# Patient Record
Sex: Female | Born: 1960 | Race: White | Hispanic: No | Marital: Married | Smoking: Former smoker
Health system: Southern US, Community
[De-identification: ages and names within clinical notes are randomized; demographics above are authoritative.]

## PROBLEM LIST (undated history)

## (undated) DIAGNOSIS — I10 Essential (primary) hypertension: Secondary | ICD-10-CM

## (undated) DIAGNOSIS — G4700 Insomnia, unspecified: Secondary | ICD-10-CM

## (undated) DIAGNOSIS — E785 Hyperlipidemia, unspecified: Secondary | ICD-10-CM

## (undated) HISTORY — PX: EYE SURGERY: SHX253

## (undated) HISTORY — PX: ABDOMINAL HYSTERECTOMY: SHX81

## (undated) HISTORY — DX: Insomnia, unspecified: G47.00

## (undated) HISTORY — PX: GALLBLADDER SURGERY: SHX652

## (undated) HISTORY — PX: TUBAL LIGATION: SHX77

## (undated) HISTORY — PX: COLONOSCOPY: SHX174

## (undated) HISTORY — DX: Hyperlipidemia, unspecified: E78.5

## (undated) HISTORY — PX: OVARIAN CYST REMOVAL: SHX89

---

## 2010-07-30 ENCOUNTER — Ambulatory Visit: Payer: Self-pay | Admitting: Internal Medicine

## 2010-10-06 ENCOUNTER — Ambulatory Visit: Payer: Self-pay | Admitting: Internal Medicine

## 2012-04-13 ENCOUNTER — Emergency Department: Payer: Self-pay | Admitting: Emergency Medicine

## 2012-04-13 LAB — CBC WITH DIFFERENTIAL/PLATELET
Basophil #: 0 10*3/uL (ref 0.0–0.1)
Basophil %: 0.6 %
Eosinophil #: 0.1 10*3/uL (ref 0.0–0.7)
Eosinophil %: 1.6 %
HGB: 13.9 g/dL (ref 12.0–16.0)
Lymphocyte #: 1.9 10*3/uL (ref 1.0–3.6)
Lymphocyte %: 31 %
MCH: 30.8 pg (ref 26.0–34.0)
MCHC: 33.8 g/dL (ref 32.0–36.0)
MCV: 91 fL (ref 80–100)
Monocyte #: 0.6 x10 3/mm (ref 0.2–0.9)
Monocyte %: 9.8 %
Neutrophil #: 3.5 10*3/uL (ref 1.4–6.5)
Neutrophil %: 57 %
Platelet: 289 10*3/uL (ref 150–440)
RBC: 4.49 10*6/uL (ref 3.80–5.20)
WBC: 6.2 10*3/uL (ref 3.6–11.0)

## 2012-04-13 LAB — URINALYSIS, COMPLETE
Bacteria: NONE SEEN
Bilirubin,UR: NEGATIVE
Blood: NEGATIVE
Ketone: NEGATIVE
Nitrite: NEGATIVE
Ph: 6 (ref 4.5–8.0)
Protein: NEGATIVE
Specific Gravity: 1.018 (ref 1.003–1.030)
Squamous Epithelial: 10
WBC UR: 1 /HPF (ref 0–5)

## 2012-04-13 LAB — BASIC METABOLIC PANEL
Anion Gap: 6 — ABNORMAL LOW (ref 7–16)
BUN: 13 mg/dL (ref 7–18)
Chloride: 105 mmol/L (ref 98–107)
Co2: 28 mmol/L (ref 21–32)
Creatinine: 0.66 mg/dL (ref 0.60–1.30)

## 2012-04-14 LAB — CA 125: CA 125: 33.6 U/mL (ref 0.0–34.0)

## 2012-04-18 ENCOUNTER — Ambulatory Visit: Payer: Self-pay | Admitting: Obstetrics & Gynecology

## 2012-04-18 LAB — CBC
HGB: 14 g/dL (ref 12.0–16.0)
MCH: 30.4 pg (ref 26.0–34.0)
MCHC: 33.8 g/dL (ref 32.0–36.0)
Platelet: 320 10*3/uL (ref 150–440)
RDW: 13.5 % (ref 11.5–14.5)

## 2012-04-18 LAB — PREGNANCY, URINE: Pregnancy Test, Urine: NEGATIVE m[IU]/mL

## 2012-04-20 ENCOUNTER — Inpatient Hospital Stay: Payer: Self-pay | Admitting: Obstetrics & Gynecology

## 2012-04-23 LAB — PATHOLOGY REPORT

## 2014-02-12 ENCOUNTER — Ambulatory Visit: Payer: Self-pay | Admitting: Obstetrics & Gynecology

## 2014-02-18 ENCOUNTER — Ambulatory Visit: Payer: Self-pay | Admitting: Obstetrics & Gynecology

## 2014-04-25 NOTE — Op Note (Signed)
PATIENT NAME:  Brooke Schultz, Brooke Schultz MR#:  [id number] DATE OF BIRTH:  Sep 03, 1960  DATE OF PROCEDURE:  04/20/2012  PREOPERATIVE DIAGNOSIS:  Large ovarian cyst, pelvic pain.   POSTOPERATIVE DIAGNOSIS:  Left ovarian cyst, pelvic pain.   PROCEDURES:  Exploratory laparotomy, left salpingo-oophorectomy.   SURGEON:  Glean Salen, M.D.   ASSISTANT:  Verlene Mayer, M.D.   ANESTHESIA: General.   ESTIMATED BLOOD LOSS: Minimal.   COMPLICATIONS: None.   FINDINGS: Large clear-filled cyst of the left ovary with normal left fallopian tube, right tube and ovary, uterus and other abdominal organs.   DISPOSITION: To recovery room in stable condition.   TECHNIQUE: The patient is prepped and draped in the usual sterile fashion after adequate anesthesia is obtained, in supine position, on the operating table, with a Foley catheter in place. Skin incision was made in the midline in vertical fashion using a scalpel down to the level of the rectus fascia. This incision is entered to the umbilicus and superior to the pubic symphysis. The rectus fascia is identified and an incision is then dissected superiorly and inferiorly using Mayo scissors. The midline of the rectus abdominis muscles were identified and separated and the peritoneum is penetrated. Immediately identification of a large ovarian cystic mass is identified and is gently palpated up through the incision. There are no adhesions noted and easily is mobile to move around. The pedicle is identified and clamped doubly with Heaney clamps, transected and suture ligated. Excellent hemostasis is noted at this time.  The ovary along with the fallopian tube is then handed off to take as specimen. Examination of the pedicle reveals excellent hemostasis as well as intra-abdominal expiration of other organs. The right ovary is small, somewhat atrophic, and normal in appearance along with the fallopian tube and uterus as well. There is no bleeding noted and no other need  for other procedures done. The sponge and needle counts are correct at this time. The peritoneum is closed with a Vicryl suture. The rectus fascia is closed with a 0 PDS suture in a running fashion. Subcutaneous tissues are irrigated and hemostasis is assured using electrocautery. Skin is closed with surgical clips and a sterile bandage is applied. The patient goes to the recovery room in stable condition. All sponge, instrument and needle counts are correct at the conclusion of the case. Foley catheter is left in place as she goes to the recovery room. ____________________________ R. Barnett Applebaum, MD rph:sb D: 04/20/2012 09:41:58 ET T: 04/20/2012 10:22:39 ET JOB#: [id number]  cc: Glean Salen, MD, <Dictator> Gae Dry MD ELECTRONICALLY SIGNED 04/20/2012 10:44

## 2014-04-28 LAB — SURGICAL PATHOLOGY

## 2014-05-04 NOTE — Op Note (Signed)
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  PROCEDURE: Total laparoscopic hysterectomy, right salpingo-oophorectomy and cystoscopy.  ANESTHESIA: General.  SURGEON: Denman George, MD  ASSISTANT: Aletha Halim, MD  BLOOD LOSS: 100 mL.  COMPLICATIONS: None.  FINDINGS: The patient had some adhesions on the bladder to the lower segment of the uterus and cervix.  DISPOSITION: To recovery room in stable condition.  TECHNIQUE: The patient is prepped and draped in the usual sterile fashion, after adequate anesthesia is obtained in the dorsal lithotomy position. A Foley catheter is inserted. A speculum was placed and a VCare device was placed on the cervix and uterus for manipulation purposes.  Attention is then turned to the abdomen where a Veress needle is inserted through a 5 mm infraumbilical incision, after Marcaine infused to anesthetize the skin. Veress needle placement is confirmed using the hanging drop technique and the abdomen is then insufflated with CO2 gas. A 5 mm trocar was then inserted under visualization with the laparoscope with no injuries or bleeding noted. The patient was placed in Trendelenburg positioning and the above findings are visualized.  An 11 mm trocar is placed in the right lower quadrant and a 5 mm trocar is placed in the left lower quadrant lateral to the epigastric blood vessels, no injuries or bleeding noted. Using a 5 mm Harmonic scalpel, a band of adhesions of the omentum to the anterior abdominal wall are carefully dissected free without bleeding or injury. The uterus and the right adnexa identified. The patient has evidence for prior left salpingo-oophorectomy. The infundibulopelvic blood vessels and ligaments are carefully  coagulated and cut on the right side, using the Harmonic scalpel. The round ligament is carefully coagulated and cut as well, and the dissection of the anterior and posterior leaves of the broad ligament is performed. Observation of ureter on the right side reveals that is out of harm's way. Dissection is carried down to the level of the uterine arteries which are carefully coagulated using the bipolar cautery device. The Harmonic scalpel is then used to dissect free the blood vessels and ligaments on the left side, including the round ligament, as the tube and ovary have already been removed in the past. Dissection is carried down to the level of the uterine arteries which are carefully coagulated and cut. The bladder is dissected free from adhesions on the anterior surface of the uterus and cervix. Careful dissection is performed until it is below the level of the cervix and the VCare device could be palpated and visualized at the cervicovaginal junction tenting through these structures. Using the Harmonic scalpel, a circumferential incision is made around the VCare device to completely amputate the uterus with cervix, holding onto the right adnexa, and this is removed vaginally.  A sponge is placed vaginally to maintain pneumoperitoneum. The vaginal cuff is closed with an interrupted 0 Polysorb suture using the Endostitch device in an interrupted fashion. Vaginal exam confirms complete closure.  The pelvic cavity is irrigated with saline and examination reveals no bleeding or injuries noted. Cystoscopy is performed with saline distention of the bladder and no visible injuries and urine seen to efflux from each ureteral orifice. Foley catheter is replaced.  Gas is expelled and trocars are removed. Skin is closed with Dermabond. The patient goes to recovery room in stable condition. All sponge,  instrument, and needle counts were correct.  ____________________________ R. Barnett Applebaum,  MD rph:TM D: 02/18/2014 16:24:00 ET T: 02/19/2014 00:52:51 ET JOB#: [id number]  cc: Glean Salen, MD, <Dictator> Gae Dry MD ELECTRONICALLY SIGNED 02/27/2014 16:00

## 2016-04-13 DIAGNOSIS — F5101 Primary insomnia: Secondary | ICD-10-CM | POA: Diagnosis not present

## 2016-04-13 DIAGNOSIS — D519 Vitamin B12 deficiency anemia, unspecified: Secondary | ICD-10-CM | POA: Diagnosis not present

## 2016-04-13 DIAGNOSIS — I10 Essential (primary) hypertension: Secondary | ICD-10-CM | POA: Diagnosis not present

## 2016-05-13 DIAGNOSIS — Z1231 Encounter for screening mammogram for malignant neoplasm of breast: Secondary | ICD-10-CM | POA: Diagnosis not present

## 2016-07-28 DIAGNOSIS — I10 Essential (primary) hypertension: Secondary | ICD-10-CM | POA: Diagnosis not present

## 2016-07-28 DIAGNOSIS — E782 Mixed hyperlipidemia: Secondary | ICD-10-CM | POA: Diagnosis not present

## 2016-08-22 ENCOUNTER — Emergency Department: Payer: 59

## 2016-08-22 ENCOUNTER — Emergency Department
Admission: EM | Admit: 2016-08-22 | Discharge: 2016-08-22 | Disposition: A | Discharge status: Home / Self Care | Payer: 59 | Attending: Emergency Medicine | Admitting: Emergency Medicine

## 2016-08-22 ENCOUNTER — Encounter: Payer: Self-pay | Admitting: Emergency Medicine

## 2016-08-22 DIAGNOSIS — M791 Myalgia, unspecified site: Secondary | ICD-10-CM

## 2016-08-22 DIAGNOSIS — R51 Headache: Secondary | ICD-10-CM | POA: Insufficient documentation

## 2016-08-22 DIAGNOSIS — F17210 Nicotine dependence, cigarettes, uncomplicated: Secondary | ICD-10-CM | POA: Diagnosis not present

## 2016-08-22 DIAGNOSIS — R519 Headache, unspecified: Secondary | ICD-10-CM

## 2016-08-22 DIAGNOSIS — R21 Rash and other nonspecific skin eruption: Secondary | ICD-10-CM | POA: Insufficient documentation

## 2016-08-22 DIAGNOSIS — I10 Essential (primary) hypertension: Secondary | ICD-10-CM | POA: Insufficient documentation

## 2016-08-22 DIAGNOSIS — J181 Lobar pneumonia, unspecified organism: Secondary | ICD-10-CM | POA: Diagnosis not present

## 2016-08-22 DIAGNOSIS — J189 Pneumonia, unspecified organism: Secondary | ICD-10-CM | POA: Diagnosis not present

## 2016-08-22 DIAGNOSIS — R509 Fever, unspecified: Secondary | ICD-10-CM | POA: Diagnosis not present

## 2016-08-22 HISTORY — DX: Essential (primary) hypertension: I10

## 2016-08-22 LAB — URINALYSIS, COMPLETE (UACMP) WITH MICROSCOPIC
BILIRUBIN URINE: NEGATIVE
Glucose, UA: NEGATIVE mg/dL
Hgb urine dipstick: NEGATIVE
Ketones, ur: 20 mg/dL — AB
LEUKOCYTES UA: NEGATIVE
Nitrite: NEGATIVE
PROTEIN: NEGATIVE mg/dL
RBC / HPF: NONE SEEN RBC/hpf (ref 0–5)
SPECIFIC GRAVITY, URINE: 1.013 (ref 1.005–1.030)
pH: 5 (ref 5.0–8.0)

## 2016-08-22 LAB — COMPREHENSIVE METABOLIC PANEL
ALBUMIN: 3.7 g/dL (ref 3.5–5.0)
ALT: 32 U/L (ref 14–54)
ANION GAP: 11 (ref 5–15)
AST: 25 U/L (ref 15–41)
Alkaline Phosphatase: 98 U/L (ref 38–126)
BUN: 13 mg/dL (ref 6–20)
CHLORIDE: 101 mmol/L (ref 101–111)
CO2: 24 mmol/L (ref 22–32)
Calcium: 8.8 mg/dL — ABNORMAL LOW (ref 8.9–10.3)
Creatinine, Ser: 0.79 mg/dL (ref 0.44–1.00)
GFR calc Af Amer: >60 mL/min (ref >60)
GFR calc non Af Amer: >60 mL/min (ref >60)
GLUCOSE: 113 mg/dL — AB (ref 65–99)
POTASSIUM: 3.5 mmol/L (ref 3.5–5.1)
Sodium: 136 mmol/L (ref 135–145)
Total Bilirubin: 0.6 mg/dL (ref 0.3–1.2)
Total Protein: 7.2 g/dL (ref 6.5–8.1)

## 2016-08-22 LAB — CBC WITH DIFFERENTIAL/PLATELET
Basophils Absolute: 0 10*3/uL (ref 0–0.1)
Basophils Relative: 0 %
Eosinophils Absolute: 0.1 10*3/uL (ref 0–0.7)
Eosinophils Relative: 1 %
HEMATOCRIT: 39.9 % (ref 35.0–47.0)
HEMOGLOBIN: 13.7 g/dL (ref 12.0–16.0)
LYMPHS PCT: 18 %
Lymphs Abs: 1.3 10*3/uL (ref 1.0–3.6)
MCH: 30.2 pg (ref 26.0–34.0)
MCHC: 34.3 g/dL (ref 32.0–36.0)
MCV: 88 fL (ref 80.0–100.0)
MONO ABS: 0.5 10*3/uL (ref 0.2–0.9)
MONOS PCT: 7 %
NEUTROS ABS: 5.5 10*3/uL (ref 1.4–6.5)
Neutrophils Relative %: 74 %
Platelets: 202 10*3/uL (ref 150–440)
RBC: 4.54 MIL/uL (ref 3.80–5.20)
RDW: 13.3 % (ref 11.5–14.5)
WBC: 7.4 10*3/uL (ref 3.6–11.0)

## 2016-08-22 LAB — SEDIMENTATION RATE: SED RATE: 51 mm/h — AB (ref 0–30)

## 2016-08-22 MED ORDER — PROCHLORPERAZINE EDISYLATE 5 MG/ML IJ SOLN
10.0000 mg | Freq: Once | INTRAMUSCULAR | Status: AC
  Administered 2016-08-22: 10 mg via INTRAVENOUS
  Filled 2016-08-22: qty 2

## 2016-08-22 MED ORDER — SODIUM CHLORIDE 0.9 % IV BOLUS (SEPSIS)
1000.0000 mL | Freq: Once | INTRAVENOUS | Status: AC
  Administered 2016-08-22: 1000 mL via INTRAVENOUS

## 2016-08-22 MED ORDER — DOXYCYCLINE HYCLATE 100 MG PO TABS
100.0000 mg | ORAL_TABLET | Freq: Once | ORAL | Status: AC
  Administered 2016-08-22: 100 mg via ORAL
  Filled 2016-08-22 (×2): qty 1

## 2016-08-22 MED ORDER — KETOROLAC TROMETHAMINE 15 MG/ML IJ SOLN
30.0000 mg | Freq: Once | INTRAMUSCULAR | Status: AC
  Administered 2016-08-22: 30 mg via INTRAVENOUS
  Filled 2016-08-22: qty 1

## 2016-08-22 MED ORDER — OXYCODONE-ACETAMINOPHEN 5-325 MG PO TABS
1.0000 | ORAL_TABLET | ORAL | Status: DC | PRN
  Administered 2016-08-22: 1 via ORAL
  Filled 2016-08-22: qty 1

## 2016-08-22 MED ORDER — DIPHENHYDRAMINE HCL 50 MG/ML IJ SOLN
25.0000 mg | Freq: Once | INTRAMUSCULAR | Status: AC
  Administered 2016-08-22: 25 mg via INTRAVENOUS
  Filled 2016-08-22: qty 1

## 2016-08-22 MED ORDER — DOXYCYCLINE HYCLATE 100 MG PO CAPS: 100.0000 mg | ORAL_CAPSULE | Freq: Two times a day (BID) | ORAL | 0 refills | Status: DC

## 2016-08-22 NOTE — ED Triage Notes (Signed)
Sick for 1 week with fever, aching, now with rash all over.  Mostly complains of headache.

## 2016-08-22 NOTE — ED Provider Notes (Addendum)
[facility] Emergency Department Provider Note  ____________________________________________   First MD Initiated Contact with Patient 08/22/16 1500     (approximate)  I have reviewed the triage vital signs and the nursing notes.   HISTORY  Chief Complaint Headache; Fever; Rash; and Generalized Body Aches   HPI Armani Gawlik is a 56 y.o. female with a history of hypertension and migraine headaches who is presenting with 1 week of chills, body aches and now over the past 2 days a frontal, 9 or 10 headache which she describes as a sharp feeling similar to migraines that she has had in years past. The patient says that she has not had a migraine for years. Denies any known sick contacts. Denies any travel. Denies any medication changes. She says that over the past 2 days she has had an intermittent headache that is frontal. There has been associated sensitivity to light and nausea but no vomiting. She says that she has not felt "hot enough" to take her temperature. Patient reports that she had 2 Tylenol early this morning about 7 or 8 AM.. Denies any known insect bites. Says the rash is also developed over the past 2 days which started on her bilateral lower extremity is now found in her chest, upper back, face and bilateral upper extremities. She denies any itching or pain to the rash. She denies any sore throat or nasal congestion but doesn't that she has had a mild cough. Denies any throat pain. Denies any other recent illnesses.patient denies any neck pain. Says that the headache radiates "to my eyes."   Past Medical History:  Diagnosis Date  . Hypertension     There are no active problems to display for this patient.   Past Surgical History:  Procedure Laterality Date  . ABDOMINAL HYSTERECTOMY      Prior to Admission medications   Not on File    Allergies Statins  No family history on file.  Social History Social History  Substance Use Topics    . Smoking status: Current Every Day Smoker  . Smokeless tobacco: Never Used  . Alcohol use No    Review of Systems  Constitutional: chills Eyes: No visual changes. ENT: No sore throat. Cardiovascular: Denies chest pain. Respiratory: as above Gastrointestinal: No abdominal pain.  no vomiting.  No diarrhea.  No constipation. Genitourinary: Negative for dysuria. Musculoskeletal: body aches  Skin: as above Neurological: Negative for focal weakness or numbness.   ____________________________________________   PHYSICAL EXAM:  VITAL SIGNS: ED Triage Vitals  Enc Vitals Group     BP 08/22/16 1124 132/90     Pulse Rate 08/22/16 1124 (!) 108     Resp 08/22/16 1124 18     Temp 08/22/16 1124 99 F (37.2 C)     Temp Source 08/22/16 1124 Oral     SpO2 08/22/16 1124 97 %     Weight 08/22/16 1124 212 lb (96.2 kg)     Height 08/22/16 1124 5\' 4"  (1.626 m)     Head Circumference --      Peak Flow --      Pain Score 08/22/16 1127 6     Pain Loc --      Pain Edu? --      Excl. in [place]? --     Constitutional: Alert and oriented.  No distress.  Eyes: Conjunctivae are normal.  Head: Atraumatic.no tenderness to palpation or nodularityalong the distribution of temporal arteries. Nose: No congestion/rhinnorhea. Mouth/Throat: Mucous membranes are  moist.  No erythema or lesions to the lips or tongue, uvula, tonsils or oral mucosa. Neck: No stridor.   Cardiovascular: Normal rate, regular rhythm. Heart rate of 87 in the room.Grossly normal heart sounds.   Respiratory: Normal respiratory effort.  No retractions. Lungs CTAB. Gastrointestinal: Soft and nontender. No distention.  Musculoskeletal: No lower extremity tenderness nor edema.  No joint effusions. Neurologic:  Normal speech and language. No gross focal neurologic deficits are appreciated. Skin:   Erythematous rash over the upper, central chestthat extends over the back to the upper thoracic back. It is also extending onto the neck and  around the eyes. There is a sandpaper-type texture to the rash. It is blanchable. There are no petechiae. There are no unroofed lesion seen around the lips. There are no lesions to the palms and soles. There are also scattered erythema that are nonpalpable to the bilateral upper extremitiesand the bilateral ankles. Psychiatric: Mood and affect are normal. Speech and behavior are normal.  ____________________________________________   LABS (all labs ordered are listed, but only abnormal results are displayed)  Labs Reviewed  COMPREHENSIVE METABOLIC PANEL - Abnormal; Notable for the following:       Result Value   Glucose, Bld 113 (*)    Calcium 8.8 (*)    All other components within normal limits  URINALYSIS, COMPLETE (UACMP) WITH MICROSCOPIC - Abnormal; Notable for the following:    Color, Urine YELLOW (*)    APPearance HAZY (*)    Ketones, ur 20 (*)    Bacteria, UA RARE (*)    Squamous Epithelial / LPF 6-30 (*)    All other components within normal limits  CULTURE, BLOOD (ROUTINE X 2)  CULTURE, BLOOD (ROUTINE X 2)  CBC WITH DIFFERENTIAL/PLATELET  B. BURGDORFI ANTIBODIES  ROCKY MTN SPOTTED FVR ABS PNL(IGG+IGM)   ____________________________________________  EKG   ____________________________________________  RADIOLOGY  No acute finding on the CT of the brain. Possible right-sided pneumonia versus atelectasis on the chest x-ray. ____________________________________________   PROCEDURES  Procedure(s) performed:   Procedures  Critical Care performed:   ____________________________________________   INITIAL IMPRESSION / ASSESSMENT AND PLAN / ED COURSE  Pertinent labs & imaging results that were available during my care of the patient were reviewed by me and considered in my medical decision making (see chart for details).  ----------------------------------------- 5:38 PM on 08/22/2016 -----------------------------------------  Patient says that her headache is  now down to a 4 out of 10. Reassuring labs. Heart rate remains in the 80s. So the elevated sedimentation rate but this is to be expected especially if there is a possible pneumonia. I will discharge the patient on doxycycline, twice a day for 10 days. We reviewed return precautions. She is understanding the plan and willing to comply. Furthermore, I did not see evidence of meningitis. Afebrile upon presentation here and last antipyretic she says was about 7 or 8 AM when she took 2 Tylenol this morning. Also without any meningismus. Also with her likely diagnosis of pneumonia. Unclear cause of the rash. However, does not fit any distinct pattern of a malignant rash at this time.      ____________________________________________   FINAL CLINICAL IMPRESSION(S) / ED DIAGNOSES  Rash. Pneumonia. Fever. Myalgia.    NEW MEDICATIONS STARTED DURING THIS VISIT:  New Prescriptions   No medications on file     Note:  This document was prepared using Dragon voice recognition software and may include unintentional dictation errors.     Orbie Pyo, MD 08/22/16  Lynden, Randall An, MD 08/22/16 1739

## 2016-08-22 NOTE — ED Triage Notes (Signed)
FIRST NURSE NOTE-PT c/o "not feeling well for 5 days". ambulatory without difficulty. Now started rash.

## 2016-08-22 NOTE — ED Notes (Signed)
Called pharmacy to send doxycycline and toradol.

## 2016-08-23 LAB — B. BURGDORFI ANTIBODIES

## 2016-08-24 LAB — ROCKY MTN SPOTTED FVR ABS PNL(IGG+IGM)
RMSF IGG: NEGATIVE
RMSF IgM: 0.76 index (ref 0.00–0.89)

## 2016-08-27 LAB — CULTURE, BLOOD (ROUTINE X 2)
Culture: NO GROWTH
Culture: NO GROWTH
Special Requests: ADEQUATE
Special Requests: ADEQUATE

## 2016-08-30 DIAGNOSIS — I10 Essential (primary) hypertension: Secondary | ICD-10-CM | POA: Diagnosis not present

## 2016-08-30 DIAGNOSIS — R21 Rash and other nonspecific skin eruption: Secondary | ICD-10-CM | POA: Diagnosis not present

## 2016-08-30 DIAGNOSIS — J168 Pneumonia due to other specified infectious organisms: Secondary | ICD-10-CM | POA: Diagnosis not present

## 2016-09-08 DIAGNOSIS — F5101 Primary insomnia: Secondary | ICD-10-CM | POA: Diagnosis not present

## 2016-09-08 DIAGNOSIS — J168 Pneumonia due to other specified infectious organisms: Secondary | ICD-10-CM | POA: Diagnosis not present

## 2016-09-08 DIAGNOSIS — I10 Essential (primary) hypertension: Secondary | ICD-10-CM | POA: Diagnosis not present

## 2016-10-26 DIAGNOSIS — J069 Acute upper respiratory infection, unspecified: Secondary | ICD-10-CM | POA: Diagnosis not present

## 2016-10-31 DIAGNOSIS — J069 Acute upper respiratory infection, unspecified: Secondary | ICD-10-CM | POA: Diagnosis not present

## 2016-12-20 ENCOUNTER — Other Ambulatory Visit: Payer: Self-pay | Admitting: Nurse Practitioner

## 2016-12-20 ENCOUNTER — Telehealth: Payer: Self-pay | Admitting: Nurse Practitioner

## 2016-12-20 DIAGNOSIS — F5101 Primary insomnia: Secondary | ICD-10-CM

## 2016-12-20 DIAGNOSIS — Z23 Encounter for immunization: Secondary | ICD-10-CM | POA: Diagnosis not present

## 2016-12-20 MED ORDER — ZOLPIDEM TARTRATE 10 MG PO TABS: 10.0000 mg | ORAL_TABLET | Freq: Every day | ORAL | 0 refills | Status: DC

## 2016-12-20 NOTE — Telephone Encounter (Signed)
I filled for 30 days and sent to her pharmacy. No refills given.

## 2016-12-20 NOTE — Telephone Encounter (Signed)
PATIENT MISSED HER LAST APPT IN NOV, WAS LAST SEEN IN September. I SCHEDULED PT 01/20/17 BECAUSE THAT IS YOUR NEXT AVAIL APPT IN PM. SHE WANTED TO KNOW COULD YOU REFILL HER AMBIEN?Marina Gravel LM

## 2016-12-20 NOTE — Progress Notes (Signed)
Refilled rx for ambien 10mg  QHS prn for 30 days with no refills.

## 2017-01-20 ENCOUNTER — Ambulatory Visit: Payer: 59 | Admitting: Nurse Practitioner

## 2017-01-20 ENCOUNTER — Encounter: Payer: Self-pay | Admitting: Nurse Practitioner

## 2017-01-20 VITALS — BP 132/70 | HR 80 | Resp 16 | Ht 64.0 in | Wt 216.8 lb

## 2017-01-20 DIAGNOSIS — F5101 Primary insomnia: Secondary | ICD-10-CM | POA: Diagnosis not present

## 2017-01-20 DIAGNOSIS — E669 Obesity, unspecified: Secondary | ICD-10-CM

## 2017-01-20 DIAGNOSIS — F411 Generalized anxiety disorder: Secondary | ICD-10-CM

## 2017-01-20 DIAGNOSIS — I10 Essential (primary) hypertension: Secondary | ICD-10-CM | POA: Diagnosis not present

## 2017-01-20 MED ORDER — ALPRAZOLAM 0.5 MG PO TABS: ORAL_TABLET | ORAL | 2 refills | Status: DC

## 2017-01-20 MED ORDER — PHENTERMINE HCL 37.5 MG PO TABS: 37.5000 mg | ORAL_TABLET | Freq: Every day | ORAL | 0 refills | Status: DC

## 2017-01-20 MED ORDER — ZOLPIDEM TARTRATE 10 MG PO TABS: 10.0000 mg | ORAL_TABLET | Freq: Every day | ORAL | 2 refills | Status: DC

## 2017-01-20 NOTE — Progress Notes (Signed)
[facility] [place], [place] [address]  Internal MEDICINE  Office Visit Note  Patient Name: Brooke Schultz  [id number]  [id number]  Date of Service: 01/20/2017  Chief Complaint  Patient presents with  . Medication Refill    The patient is here for routine follow up. Today, blood pressure is elevated. She has had increased work stress anddifficulty sleeping. Admits she just came from stressful meeting at work, likely driving up blood pressure. Currently taking lisinopril 20mg  daily and bystolic 20mg  daily. She has had about 15pound weight gain since her most recent visit.     Pt is here for routine follow up.    Current Medication: Outpatient Encounter Medications as of 01/20/2017  Medication Sig  . Acetaminophen (TYLENOL EXTRA STRENGTH PO) Take 2 tablets by mouth as needed.  . ALPRAZolam (XANAX) 0.5 MG tablet Take 0.5 mg by mouth at bedtime as needed for anxiety. 1-2 tablets po qhs  . Choline Fenofibrate (FENOFIBRIC ACID) 135 MG CPDR Take by mouth every evening.  Marland Kitchen doxycycline (VIBRAMYCIN) 100 MG capsule Take 1 capsule (100 mg total) by mouth 2 (two) times daily.  . Fluoxetine HCl, PMDD, 10 MG CAPS Take by mouth daily.  Marland Kitchen lisinopril (PRINIVIL,ZESTRIL) 10 MG tablet Take 10 mg by mouth daily.  . meclizine (ANTIVERT) 12.5 MG tablet Take 12.5 mg by mouth 3 (three) times daily.  . Multiple Vitamin (MULTIVITAMIN) tablet Take 1 tablet by mouth daily.  . nebivolol (BYSTOLIC) 10 MG tablet Take 10 mg by mouth daily.  . phentermine 37.5 MG capsule Take 37.5 mg by mouth daily.  Marland Kitchen zolpidem (AMBIEN) 10 MG tablet Take 1 tablet (10 mg total) by mouth at bedtime.   No facility-administered encounter medications on file as of 01/20/2017.     Surgical History: Past Surgical History:  Procedure Laterality Date  . ABDOMINAL HYSTERECTOMY    . EYE SURGERY    . GALLBLADDER SURGERY    . OVARIAN CYST REMOVAL    . TUBAL LIGATION      Medical History: Past Medical  History:  Diagnosis Date  . Hyperlipidemia   . Hypertension   . Insomnia     Family History: Family History  Problem Relation Age of Onset  . Hypertension Mother     Social History   Socioeconomic History  . Marital status: Married    Spouse name: Not on file  . Number of children: Not on file  . Years of education: Not on file  . Highest education level: Not on file  Social Needs  . Financial resource strain: Not on file  . Food insecurity - worry: Not on file  . Food insecurity - inability: Not on file  . Transportation needs - medical: Not on file  . Transportation needs - non-medical: Not on file  Occupational History  . Not on file  Tobacco Use  . Smoking status: Current Every Day Smoker    Types: Cigarettes  . Smokeless tobacco: Never Used  Substance and Sexual Activity  . Alcohol use: No  . Drug use: No  . Sexual activity: Not on file  Other Topics Concern  . Not on file  Social History Narrative  . Not on file      Review of Systems  Constitutional: Positive for fatigue. Negative for chills and unexpected weight change.  HENT: Negative for congestion, postnasal drip, rhinorrhea, sneezing and sore throat.   Eyes: Negative for redness.  Respiratory: Negative for cough, chest tightness, shortness of breath and wheezing.  Cardiovascular: Negative for chest pain and palpitations.  Gastrointestinal: Negative for abdominal pain, constipation, diarrhea, nausea and vomiting.  Genitourinary: Negative for dysuria and frequency.  Musculoskeletal: Negative for arthralgias, back pain, joint swelling and neck pain.  Skin: Negative for rash.  Allergic/Immunologic: Negative for environmental allergies.  Neurological: Negative for tremors, weakness, numbness and headaches.  Hematological: Negative for adenopathy. Does not bruise/bleed easily.  Psychiatric/Behavioral: Negative for behavioral problems (Depression), sleep disturbance and suicidal ideas. The patient is  nervous/anxious.     Today's Vitals   01/20/17 1429 01/20/17 1458  BP: (!) 149/101 132/70  Pulse: 80   Resp: 16   SpO2: 99%   Weight: 216 lb 12.8 oz (98.3 kg)   Height: 5\' 4"  (1.626 m)     Physical Exam  Constitutional: She is oriented to person, place, and time. She appears well-developed and well-nourished. No distress.  HENT:  Head: Normocephalic and atraumatic.  Mouth/Throat: Oropharynx is clear and moist. No oropharyngeal exudate.  Eyes: EOM are normal. Pupils are equal, round, and reactive to light.  Neck: Normal range of motion. Neck supple. No JVD present. Carotid bruit is not present. No tracheal deviation present. No thyromegaly present.  Cardiovascular: Normal rate, regular rhythm and normal heart sounds. Exam reveals no gallop and no friction rub.  No murmur heard. Pulmonary/Chest: Effort normal and breath sounds normal. No respiratory distress. She has no wheezes. She has no rales. She exhibits no tenderness.  Abdominal: Soft. Bowel sounds are normal. There is no tenderness.  Musculoskeletal: Normal range of motion.  Lymphadenopathy:    She has no cervical adenopathy.  Neurological: She is alert and oriented to person, place, and time. No cranial nerve deficit.  Skin: Skin is warm and dry. She is not diaphoretic.  Psychiatric: She has a normal mood and affect. Her behavior is normal. Judgment and thought content normal.    Assessment/Plan:  1. Essential hypertension Blood pressure stable. Continue bp medication as prescribed. Refills sent to pharmacy today.  2. Primary insomnia - zolpidem (AMBIEN) 10 MG tablet; Take 1 tablet (10 mg total) by mouth at bedtime.  Dispense: 30 tablet; Refill: 2  3. Mild obesity - phentermine (ADIPEX-P) 37.5 MG tablet; Take 1 tablet (37.5 mg total) by mouth daily before breakfast.  Dispense: 30 tablet; Refill: 0  Recommend 1500 calorie diet and increased exercise. Should participate in routine exercise program 3-4 times per day.    4. GAD (generalized anxiety disorder) - ALPRAZolam (XANAX) 0.5 MG tablet; Take 1 tablet po BID prn  Dispense: 60 tablet; Refill: 2  General Counseling: Delbra verbalizes understanding of the findings of todays visit and agrees with plan of treatment. I have discussed any further diagnostic evaluation that may be needed or ordered today. We also reviewed her medications today. she has been encouraged to call the office with any questions or concerns that should arise related to todays visit.    There is a liability release in patients' chart. There has been a 10 minute discussion about the side effects including but not limited to elevated blood pressure, anxiety, lack of sleep and dry mouth. Pt understands and will like to start/continue on appetite suppressant at this time. There will be one month RX given at the time of visit with proper follow up. Nova diet plan with restricted calories is given to the pt. Pt understands and agrees with  plan of treatment  This patient was seen by Leretha Pol, FNP- C in Collaboration with Dr Lavera Guise as a  part of collaborative care agreement  Time spent: 20 inutes   Dr Lavera Guise Internal medicine

## 2017-01-25 ENCOUNTER — Telehealth: Payer: Self-pay

## 2017-01-25 NOTE — Telephone Encounter (Signed)
Faxed cologuard and face sheet as per Leretha Pol.

## 2017-01-30 DIAGNOSIS — Z1211 Encounter for screening for malignant neoplasm of colon: Secondary | ICD-10-CM | POA: Diagnosis not present

## 2017-01-30 DIAGNOSIS — Z1212 Encounter for screening for malignant neoplasm of rectum: Secondary | ICD-10-CM | POA: Diagnosis not present

## 2017-03-02 DIAGNOSIS — R195 Other fecal abnormalities: Secondary | ICD-10-CM | POA: Diagnosis not present

## 2017-03-13 DIAGNOSIS — K648 Other hemorrhoids: Secondary | ICD-10-CM | POA: Diagnosis not present

## 2017-03-13 DIAGNOSIS — K621 Rectal polyp: Secondary | ICD-10-CM | POA: Diagnosis not present

## 2017-03-13 DIAGNOSIS — D125 Benign neoplasm of sigmoid colon: Secondary | ICD-10-CM | POA: Diagnosis not present

## 2017-03-13 DIAGNOSIS — D126 Benign neoplasm of colon, unspecified: Secondary | ICD-10-CM | POA: Diagnosis not present

## 2017-03-13 DIAGNOSIS — K620 Anal polyp: Secondary | ICD-10-CM | POA: Diagnosis not present

## 2017-03-13 DIAGNOSIS — R195 Other fecal abnormalities: Secondary | ICD-10-CM | POA: Diagnosis not present

## 2017-03-13 DIAGNOSIS — D123 Benign neoplasm of transverse colon: Secondary | ICD-10-CM | POA: Diagnosis not present

## 2017-03-13 DIAGNOSIS — K640 First degree hemorrhoids: Secondary | ICD-10-CM | POA: Diagnosis not present

## 2017-03-13 DIAGNOSIS — D128 Benign neoplasm of rectum: Secondary | ICD-10-CM | POA: Diagnosis not present

## 2017-03-21 ENCOUNTER — Ambulatory Visit: Payer: Self-pay | Admitting: Nurse Practitioner

## 2017-04-10 ENCOUNTER — Ambulatory Visit (INDEPENDENT_AMBULATORY_CARE_PROVIDER_SITE_OTHER): Payer: 59 | Admitting: Nurse Practitioner

## 2017-04-10 ENCOUNTER — Encounter: Payer: Self-pay | Admitting: Nurse Practitioner

## 2017-04-10 VITALS — BP 122/82 | HR 68 | Resp 16 | Ht 64.0 in | Wt 237.6 lb

## 2017-04-10 DIAGNOSIS — F5101 Primary insomnia: Secondary | ICD-10-CM

## 2017-04-10 DIAGNOSIS — E782 Mixed hyperlipidemia: Secondary | ICD-10-CM

## 2017-04-10 DIAGNOSIS — F411 Generalized anxiety disorder: Secondary | ICD-10-CM

## 2017-04-10 DIAGNOSIS — I10 Essential (primary) hypertension: Secondary | ICD-10-CM

## 2017-04-10 MED ORDER — ZOLPIDEM TARTRATE 10 MG PO TABS: 10.0000 mg | ORAL_TABLET | Freq: Every day | ORAL | 1 refills | Status: DC

## 2017-04-10 MED ORDER — NEBIVOLOL HCL 10 MG PO TABS: 10.0000 mg | ORAL_TABLET | Freq: Every day | ORAL | 4 refills | Status: DC

## 2017-04-10 MED ORDER — LISINOPRIL 10 MG PO TABS: 10.0000 mg | ORAL_TABLET | Freq: Every day | ORAL | 4 refills | Status: DC

## 2017-04-10 MED ORDER — FENOFIBRIC ACID 135 MG PO CPDR: 1.0000 | DELAYED_RELEASE_CAPSULE | Freq: Every evening | ORAL | 4 refills | Status: DC

## 2017-04-10 NOTE — Progress Notes (Signed)
[facility] [place], [place] [address]  Internal MEDICINE  Office Visit Note  Patient Name: Brooke Schultz  [id number]  [id number]  Date of Service: 05/03/2017  Pt is here for routine follow up.   Chief Complaint  Patient presents with  . Hypertension    Since her most recent visit, the patient has had colonoscopy done as her cologuard test was positive. There were a few pre-cancerous polyps removed, but no colon cancer was found. She has quit smoking. It has been about 6 weeks without smoking. She admits that she is eating a bit more than she was prior to quitting. She is feeling well and has already noticed increased energy levels since quitting. Her blood pressure is more stable as well.   Hypertension  This is a chronic problem. The current episode started more than 1 year ago. The problem is unchanged. The problem is controlled. Associated symptoms include shortness of breath. Pertinent negatives include no chest pain, neck pain or palpitations. There are no associated agents to hypertension. Risk factors for coronary artery disease include dyslipidemia, diabetes mellitus and obesity. Past treatments include ACE inhibitors and beta blockers. The current treatment provides moderate improvement. Compliance problems include exercise and diet.        Current Medication: Outpatient Encounter Medications as of 04/10/2017  Medication Sig  . Acetaminophen (TYLENOL EXTRA STRENGTH PO) Take 2 tablets by mouth as needed.  . ALPRAZolam (XANAX) 0.5 MG tablet Take 1 tablet po BID prn  . Choline Fenofibrate (FENOFIBRIC ACID) 135 MG CPDR Take 1 capsule by mouth every evening.  Marland Kitchen doxycycline (VIBRAMYCIN) 100 MG capsule Take 1 capsule (100 mg total) by mouth 2 (two) times daily.  . Fluoxetine HCl, PMDD, 10 MG CAPS Take by mouth daily.  Marland Kitchen lisinopril (PRINIVIL,ZESTRIL) 10 MG tablet Take 1 tablet (10 mg total) by mouth daily.  . meclizine (ANTIVERT) 12.5 MG tablet Take 12.5 mg  by mouth 3 (three) times daily.  . Multiple Vitamin (MULTIVITAMIN) tablet Take 1 tablet by mouth daily.  . nebivolol (BYSTOLIC) 10 MG tablet Take 1 tablet (10 mg total) by mouth daily.  . phentermine (ADIPEX-P) 37.5 MG tablet Take 1 tablet (37.5 mg total) by mouth daily before breakfast.  . polyethylene glycol-electrolytes (NULYTELY/GOLYTELY) 420 g solution U UTD  . zolpidem (AMBIEN) 10 MG tablet Take 1 tablet (10 mg total) by mouth at bedtime.  . [DISCONTINUED] Choline Fenofibrate (FENOFIBRIC ACID) 135 MG CPDR Take by mouth every evening.  . [DISCONTINUED] lisinopril (PRINIVIL,ZESTRIL) 10 MG tablet Take 10 mg by mouth daily.  . [DISCONTINUED] nebivolol (BYSTOLIC) 10 MG tablet Take 10 mg by mouth daily.  . [DISCONTINUED] zolpidem (AMBIEN) 10 MG tablet Take 1 tablet (10 mg total) by mouth at bedtime.   No facility-administered encounter medications on file as of 04/10/2017.     Surgical History: Past Surgical History:  Procedure Laterality Date  . ABDOMINAL HYSTERECTOMY    . COLONOSCOPY    . EYE SURGERY    . GALLBLADDER SURGERY    . OVARIAN CYST REMOVAL    . TUBAL LIGATION      Medical History: Past Medical History:  Diagnosis Date  . Hyperlipidemia   . Hypertension   . Insomnia     Family History: Family History  Problem Relation Age of Onset  . Hypertension Mother     Social History   Socioeconomic History  . Marital status: Married    Spouse name: Not on file  . Number of children: Not  on file  . Years of education: Not on file  . Highest education level: Not on file  Occupational History  . Not on file  Social Needs  . Financial resource strain: Not on file  . Food insecurity:    Worry: Not on file    Inability: Not on file  . Transportation needs:    Medical: Not on file    Non-medical: Not on file  Tobacco Use  . Smoking status: Former Smoker    Types: Cigarettes  . Smokeless tobacco: Never Used  . Tobacco comment: been smoke free for 7weeks  Substance  and Sexual Activity  . Alcohol use: No  . Drug use: No  . Sexual activity: Not on file  Lifestyle  . Physical activity:    Days per week: Not on file    Minutes per session: Not on file  . Stress: Not on file  Relationships  . Social connections:    Talks on phone: Not on file    Gets together: Not on file    Attends religious service: Not on file    Active member of club or organization: Not on file    Attends meetings of clubs or organizations: Not on file    Relationship status: Not on file  . Intimate partner violence:    Fear of current or ex partner: Not on file    Emotionally abused: Not on file    Physically abused: Not on file    Forced sexual activity: Not on file  Other Topics Concern  . Not on file  Social History Narrative  . Not on file      Review of Systems  Constitutional: Positive for fatigue. Negative for chills and unexpected weight change.  HENT: Negative for congestion, postnasal drip, rhinorrhea, sneezing and sore throat.   Eyes: Negative.  Negative for redness.  Respiratory: Positive for shortness of breath. Negative for cough, chest tightness and wheezing.   Cardiovascular: Negative for chest pain and palpitations.  Gastrointestinal: Negative for abdominal pain, constipation, diarrhea, nausea and vomiting.  Endocrine: Negative for cold intolerance, heat intolerance, polydipsia, polyphagia and polyuria.  Genitourinary: Negative for dysuria and frequency.  Musculoskeletal: Negative for arthralgias, back pain, joint swelling, myalgias and neck pain.  Skin: Negative for rash.  Allergic/Immunologic: Positive for environmental allergies.  Neurological: Negative for tremors, weakness and numbness.  Hematological: Negative for adenopathy. Does not bruise/bleed easily.  Psychiatric/Behavioral: Positive for sleep disturbance. Negative for behavioral problems (Depression) and suicidal ideas. The patient is nervous/anxious.     Today's Vitals   04/10/17  1522  BP: 122/82  Pulse: 68  Resp: 16  SpO2: 97%  Weight: 237 lb 9.6 oz (107.8 kg)  Height: 5\' 4"  (1.626 m)    Physical Exam  Constitutional: She is oriented to person, place, and time. She appears well-developed and well-nourished. No distress.  HENT:  Head: Normocephalic and atraumatic.  Mouth/Throat: Oropharynx is clear and moist. No oropharyngeal exudate.  Eyes: Pupils are equal, round, and reactive to light. EOM are normal.  Neck: Normal range of motion. Neck supple. No JVD present. Carotid bruit is not present. No tracheal deviation present. No thyromegaly present.  Cardiovascular: Normal rate, regular rhythm and normal heart sounds. Exam reveals no gallop and no friction rub.  No murmur heard. Pulmonary/Chest: Effort normal and breath sounds normal. No respiratory distress. She has no wheezes. She has no rales. She exhibits no tenderness.  Abdominal: Soft. Bowel sounds are normal. There is no tenderness.  Musculoskeletal:  Normal range of motion.  Lymphadenopathy:    She has no cervical adenopathy.  Neurological: She is alert and oriented to person, place, and time. No cranial nerve deficit.  Skin: Skin is warm and dry. She is not diaphoretic.  Psychiatric: She has a normal mood and affect. Her behavior is normal. Judgment and thought content normal.  Nursing note and vitals reviewed.  Assessment/Plan:  1. Essential hypertension Stable. Continue BP medication as prescribed  - lisinopril (PRINIVIL,ZESTRIL) 10 MG tablet; Take 1 tablet (10 mg total) by mouth daily. - nebivolol (BYSTOLIC) 10 MG tablet; Take 1 tablet (10 mg total) by mouth daily.  2. Mixed hyperlipidemia Cholesterol panel stable. Continue fenofibrate as prescribed. - Choline Fenofibrate (FENOFIBRIC ACID) 135 MG CPDR; Take 1 capsule by mouth every evening.  3. GAD (generalized anxiety disorder) Continue prozac as prescribed  4. Primary insomnia - zolpidem (AMBIEN) 10 MG tablet; Take 1 tablet (10 mg total)  by mouth at bedtime.  Other orders - polyethylene glycol-electrolytes (NULYTELY/GOLYTELY) 420 g solution; U UTD  General Counseling: Jeslyn verbalizes understanding of the findings of todays visit and agrees with plan of treatment. I have discussed any further diagnostic evaluation that may be needed or ordered today. We also reviewed her medications today. she has been encouraged to call the office with any questions or concerns that should arise related to todays visit.   This patient was seen by Leretha Pol, FNP- C in Collaboration with Dr Lavera Guise as a part of collaborative care agreement  Meds ordered this encounter  Medications  . Choline Fenofibrate (FENOFIBRIC ACID) 135 MG CPDR    Sig: Take 1 capsule by mouth every evening.    Dispense:  90 capsule    Refill:  4    Order Specific Question:   Supervising Provider    Answer:   Lavera Guise [[id number]]  . lisinopril (PRINIVIL,ZESTRIL) 10 MG tablet    Sig: Take 1 tablet (10 mg total) by mouth daily.    Dispense:  90 tablet    Refill:  4    Order Specific Question:   Supervising Provider    Answer:   Lavera Guise [[id number]]  . nebivolol (BYSTOLIC) 10 MG tablet    Sig: Take 1 tablet (10 mg total) by mouth daily.    Dispense:  90 tablet    Refill:  4    Order Specific Question:   Supervising Provider    Answer:   Lavera Guise [[id number]]  . zolpidem (AMBIEN) 10 MG tablet    Sig: Take 1 tablet (10 mg total) by mouth at bedtime.    Dispense:  90 tablet    Refill:  1    Order Specific Question:   Supervising Provider    Answer:   Lavera Guise [[id number]]    Time spent: 23 Minutes    Dr Lavera Guise Internal medicine

## 2017-05-03 DIAGNOSIS — E782 Mixed hyperlipidemia: Secondary | ICD-10-CM | POA: Insufficient documentation

## 2017-05-03 DIAGNOSIS — F411 Generalized anxiety disorder: Secondary | ICD-10-CM | POA: Insufficient documentation

## 2017-05-03 DIAGNOSIS — F5101 Primary insomnia: Secondary | ICD-10-CM | POA: Insufficient documentation

## 2017-05-31 ENCOUNTER — Other Ambulatory Visit: Payer: Self-pay

## 2017-05-31 ENCOUNTER — Encounter: Payer: Self-pay | Admitting: Nurse Practitioner

## 2017-05-31 MED ORDER — NEBIVOLOL HCL 20 MG PO TABS: ORAL_TABLET | ORAL | 5 refills | Status: DC

## 2017-05-31 MED ORDER — LISINOPRIL 20 MG PO TABS: 20.0000 mg | ORAL_TABLET | Freq: Every day | ORAL | 5 refills | Status: DC

## 2017-07-11 ENCOUNTER — Ambulatory Visit: Payer: Self-pay | Admitting: Nurse Practitioner

## 2017-08-07 ENCOUNTER — Other Ambulatory Visit: Payer: Self-pay | Admitting: Nurse Practitioner

## 2017-08-07 DIAGNOSIS — E782 Mixed hyperlipidemia: Secondary | ICD-10-CM

## 2017-08-07 MED ORDER — FENOFIBRIC ACID 135 MG PO CPDR: 1.0000 | DELAYED_RELEASE_CAPSULE | Freq: Every evening | ORAL | 2 refills | Status: DC

## 2017-08-07 MED ORDER — NEBIVOLOL HCL 20 MG PO TABS: ORAL_TABLET | ORAL | 2 refills | Status: DC

## 2017-08-07 MED ORDER — LISINOPRIL 20 MG PO TABS: 20.0000 mg | ORAL_TABLET | Freq: Every day | ORAL | 2 refills | Status: DC

## 2017-09-07 ENCOUNTER — Ambulatory Visit: Payer: Self-pay | Admitting: Nurse Practitioner

## 2017-10-12 ENCOUNTER — Ambulatory Visit: Payer: 59 | Admitting: Nurse Practitioner

## 2017-10-12 ENCOUNTER — Encounter: Payer: Self-pay | Admitting: Nurse Practitioner

## 2017-10-12 VITALS — BP 144/84 | HR 60 | Resp 16 | Ht 64.0 in | Wt 237.0 lb

## 2017-10-12 DIAGNOSIS — Z23 Encounter for immunization: Secondary | ICD-10-CM

## 2017-10-12 DIAGNOSIS — F411 Generalized anxiety disorder: Secondary | ICD-10-CM | POA: Diagnosis not present

## 2017-10-12 DIAGNOSIS — I10 Essential (primary) hypertension: Secondary | ICD-10-CM

## 2017-10-12 DIAGNOSIS — F5101 Primary insomnia: Secondary | ICD-10-CM

## 2017-10-12 DIAGNOSIS — E538 Deficiency of other specified B group vitamins: Secondary | ICD-10-CM | POA: Diagnosis not present

## 2017-10-12 DIAGNOSIS — E669 Obesity, unspecified: Secondary | ICD-10-CM

## 2017-10-12 MED ORDER — ALPRAZOLAM 0.5 MG PO TABS: ORAL_TABLET | ORAL | 2 refills | Status: DC

## 2017-10-12 MED ORDER — CYANOCOBALAMIN 1000 MCG/ML IJ SOLN
1000.0000 ug | Freq: Once | INTRAMUSCULAR | Status: AC
  Administered 2017-10-12: 1000 ug via INTRAMUSCULAR

## 2017-10-12 MED ORDER — FLUOXETINE HCL (PMDD) 10 MG PO CAPS: 10.0000 mg | ORAL_CAPSULE | Freq: Every day | ORAL | 3 refills | Status: DC

## 2017-10-12 MED ORDER — ZOLPIDEM TARTRATE 10 MG PO TABS: 10.0000 mg | ORAL_TABLET | Freq: Every day | ORAL | 3 refills | Status: DC

## 2017-10-12 MED ORDER — PHENTERMINE HCL 37.5 MG PO TABS: 37.5000 mg | ORAL_TABLET | Freq: Every day | ORAL | 0 refills | Status: DC

## 2017-10-12 NOTE — Progress Notes (Signed)
[facility] [place],  [address]  Internal MEDICINE  Office Visit Note  Patient Name: Brooke Schultz  [id number]  [id number]  Date of Service: 10/12/2017  Chief Complaint  Patient presents with  . Hypertension  . Hyperlipidemia  . Medical Management of Chronic Issues    weight management    The patient states that she is having intermittent, but severe hot flashes. Gets drenched in hair and face with sweat. Gets very red in the face. These instances last for about 30 minutes or so and then resolve. She was on fluoxetine at one point, but has not taken this in some time .She notices this is worse when she is out at the store or somewhere in public.       Current Medication: Outpatient Encounter Medications as of 10/12/2017  Medication Sig  . Acetaminophen (TYLENOL EXTRA STRENGTH PO) Take 2 tablets by mouth as needed.  . ALPRAZolam (XANAX) 0.5 MG tablet Take 1 tablet po BID prn  . Choline Fenofibrate (FENOFIBRIC ACID) 135 MG CPDR Take 1 capsule by mouth every evening.  Marland Kitchen doxycycline (VIBRAMYCIN) 100 MG capsule Take 1 capsule (100 mg total) by mouth 2 (two) times daily.  Marland Kitchen lisinopril (PRINIVIL,ZESTRIL) 20 MG tablet Take 1 tablet (20 mg total) by mouth daily.  . meclizine (ANTIVERT) 12.5 MG tablet Take 12.5 mg by mouth 3 (three) times daily.  . Multiple Vitamin (MULTIVITAMIN) tablet Take 1 tablet by mouth daily.  . Nebivolol HCl (BYSTOLIC) 20 MG TABS Take 1 tab po daily  . phentermine (ADIPEX-P) 37.5 MG tablet Take 1 tablet (37.5 mg total) by mouth daily before breakfast.  . zolpidem (AMBIEN) 10 MG tablet Take 1 tablet (10 mg total) by mouth at bedtime.  . [DISCONTINUED] ALPRAZolam (XANAX) 0.5 MG tablet Take 1 tablet po BID prn  . [DISCONTINUED] phentermine (ADIPEX-P) 37.5 MG tablet Take 1 tablet (37.5 mg total) by mouth daily before breakfast.  . [DISCONTINUED] zolpidem (AMBIEN) 10 MG tablet Take 1 tablet (10 mg total) by mouth at bedtime.  .  Fluoxetine HCl, PMDD, 10 MG CAPS Take 1 capsule (10 mg total) by mouth daily.  . polyethylene glycol-electrolytes (NULYTELY/GOLYTELY) 420 g solution U UTD  . [DISCONTINUED] Fluoxetine HCl, PMDD, 10 MG CAPS Take by mouth daily.  . [EXPIRED] cyanocobalamin ((VITAMIN B-12)) injection 1,000 mcg    No facility-administered encounter medications on file as of 10/12/2017.     Surgical History: Past Surgical History:  Procedure Laterality Date  . ABDOMINAL HYSTERECTOMY    . COLONOSCOPY    . EYE SURGERY    . GALLBLADDER SURGERY    . OVARIAN CYST REMOVAL    . TUBAL LIGATION      Medical History: Past Medical History:  Diagnosis Date  . Hyperlipidemia   . Hypertension   . Insomnia     Family History: Family History  Problem Relation Age of Onset  . Hypertension Mother     Social History   Socioeconomic History  . Marital status: Married    Spouse name: Not on file  . Number of children: Not on file  . Years of education: Not on file  . Highest education level: Not on file  Occupational History  . Not on file  Social Needs  . Financial resource strain: Not on file  . Food insecurity:    Worry: Not on file    Inability: Not on file  . Transportation needs:    Medical: Not on file    Non-medical:  Not on file  Tobacco Use  . Smoking status: Former Smoker    Types: Cigarettes  . Smokeless tobacco: Never Used  . Tobacco comment: been smoke free for 7weeks  Substance and Sexual Activity  . Alcohol use: No  . Drug use: No  . Sexual activity: Not on file  Lifestyle  . Physical activity:    Days per week: Not on file    Minutes per session: Not on file  . Stress: Not on file  Relationships  . Social connections:    Talks on phone: Not on file    Gets together: Not on file    Attends religious service: Not on file    Active member of club or organization: Not on file    Attends meetings of clubs or organizations: Not on file    Relationship status: Not on file  .  Intimate partner violence:    Fear of current or ex partner: Not on file    Emotionally abused: Not on file    Physically abused: Not on file    Forced sexual activity: Not on file  Other Topics Concern  . Not on file  Social History Narrative  . Not on file      Review of Systems  Constitutional: Positive for fatigue. Negative for activity change, chills and unexpected weight change.  HENT: Negative for congestion, postnasal drip, rhinorrhea, sneezing and sore throat.   Eyes: Negative.  Negative for redness.  Respiratory: Negative for cough, chest tightness, shortness of breath and wheezing.   Cardiovascular: Negative for chest pain and palpitations.  Gastrointestinal: Negative for abdominal pain, constipation, diarrhea, nausea and vomiting.  Endocrine: Negative for cold intolerance, heat intolerance, polydipsia, polyphagia and polyuria.  Genitourinary: Negative.  Negative for dysuria and frequency.  Musculoskeletal: Negative for arthralgias, back pain, joint swelling and neck pain.  Skin: Negative for rash.  Allergic/Immunologic: Negative for environmental allergies.  Neurological: Negative for dizziness, tremors, weakness, numbness and headaches.  Hematological: Negative for adenopathy. Does not bruise/bleed easily.  Psychiatric/Behavioral: Positive for sleep disturbance. Negative for behavioral problems (Depression) and suicidal ideas. The patient is nervous/anxious.     Today's Vitals   10/12/17 1148  BP: (!) 144/84  Pulse: 60  Resp: 16  SpO2: 97%  Weight: 237 lb (107.5 kg)  Height: 5\' 4"  (1.626 m)    Physical Exam  Constitutional: She is oriented to person, place, and time. She appears well-developed and well-nourished. No distress.  HENT:  Head: Normocephalic and atraumatic.  Nose: Nose normal.  Mouth/Throat: Oropharynx is clear and moist. No oropharyngeal exudate.  Eyes: Pupils are equal, round, and reactive to light. Conjunctivae and EOM are normal.  Neck:  Normal range of motion. Neck supple. No JVD present. Carotid bruit is not present. No tracheal deviation present. No thyromegaly present.  Cardiovascular: Normal rate, regular rhythm and normal heart sounds. Exam reveals no gallop and no friction rub.  No murmur heard. Pulmonary/Chest: Effort normal and breath sounds normal. No respiratory distress. She has no wheezes. She has no rales. She exhibits no tenderness.  Abdominal: Soft. Bowel sounds are normal. There is no tenderness.  Musculoskeletal: Normal range of motion.  Lymphadenopathy:    She has no cervical adenopathy.  Neurological: She is alert and oriented to person, place, and time. No cranial nerve deficit.  Skin: Skin is warm and dry. Capillary refill takes less than 2 seconds. She is not diaphoretic.  Psychiatric: She has a normal mood and affect. Her behavior is normal. Judgment  and thought content normal.  Nursing note and vitals reviewed.  Assessment/Plan: 1. Essential hypertension Stable. Continue bp medication as prescribed  2. GAD (generalized anxiety disorder) Restart fluoxetine 10mg  daily. May continue to take alprazolam 0.5mg  up to twice daily if needed for acute anxiety. New prescription provided today.  - ALPRAZolam (XANAX) 0.5 MG tablet; Take 1 tablet po BID prn  Dispense: 60 tablet; Refill: 2 - Fluoxetine HCl, PMDD, 10 MG CAPS; Take 1 capsule (10 mg total) by mouth daily.  Dispense: 30 capsule; Refill: 3  3. B12 deficiency Vitamin b12 injection administered today.  - cyanocobalamin ((VITAMIN B-12)) injection 1,000 mcg  4. Mild obesity Restart phentermine 37.5mg  tablets daily. Limit calorie intake to 1200-1500 calories per day. Recommended she participate in low-impact, low-intensity, cardiovascular activity to help with weight loss.  - phentermine (ADIPEX-P) 37.5 MG tablet; Take 1 tablet (37.5 mg total) by mouth daily before breakfast.  Dispense: 30 tablet; Refill: 0  5. Primary insomnia May continue ambien 10mg   at bedtime as needed for insomnia. New prescription provided today.  - zolpidem (AMBIEN) 10 MG tablet; Take 1 tablet (10 mg total) by mouth at bedtime.  Dispense: 30 tablet; Refill: 3  6. Flu vaccine need - Flu Vaccine MDCK QUAD PF  General Counseling: Pairlee verbalizes understanding of the findings of todays visit and agrees with plan of treatment. I have discussed any further diagnostic evaluation that may be needed or ordered today. We also reviewed her medications today. she has been encouraged to call the office with any questions or concerns that should arise related to todays visit.   There is a liability release in patients' chart. There has been a 10 minute discussion about the side effects including but not limited to elevated blood pressure, anxiety, lack of sleep and dry mouth. Pt understands and will like to start/continue on appetite suppressant at this time. There will be one month RX given at the time of visit with proper follow up. Nova diet plan with restricted calories is given to the pt. Pt understands and agrees with  plan of treatment  This patient was seen by Leretha Pol FNP Collaboration with Dr Lavera Guise as a part of collaborative care agreement  Orders Placed This Encounter  Procedures  . Flu Vaccine MDCK QUAD PF    Meds ordered this encounter  Medications  . cyanocobalamin ((VITAMIN B-12)) injection 1,000 mcg  . ALPRAZolam (XANAX) 0.5 MG tablet    Sig: Take 1 tablet po BID prn    Dispense:  60 tablet    Refill:  2    Order Specific Question:   Supervising Provider    Answer:   Lavera Guise [place]  . Fluoxetine HCl, PMDD, 10 MG CAPS    Sig: Take 1 capsule (10 mg total) by mouth daily.    Dispense:  30 capsule    Refill:  3    Order Specific Question:   Supervising Provider    Answer:   Lavera Guise [[id number]]  . phentermine (ADIPEX-P) 37.5 MG tablet    Sig: Take 1 tablet (37.5 mg total) by mouth daily before breakfast.    Dispense:  30 tablet     Refill:  0    Order Specific Question:   Supervising Provider    Answer:   Lavera Guise [place]  . zolpidem (AMBIEN) 10 MG tablet    Sig: Take 1 tablet (10 mg total) by mouth at bedtime.    Dispense:  30 tablet  Refill:  3    Patient would like to fill as 30 day prescription please.    Order Specific Question:   Supervising Provider    Answer:   Lavera Guise [[id number]]    Time spent: 65 Minutes      Dr Lavera Guise Internal medicine

## 2018-01-10 DIAGNOSIS — Z719 Counseling, unspecified: Secondary | ICD-10-CM | POA: Diagnosis not present

## 2018-01-12 ENCOUNTER — Ambulatory Visit: Payer: Self-pay | Admitting: Nurse Practitioner

## 2018-01-17 DIAGNOSIS — Z719 Counseling, unspecified: Secondary | ICD-10-CM | POA: Diagnosis not present

## 2018-01-24 DIAGNOSIS — Z719 Counseling, unspecified: Secondary | ICD-10-CM | POA: Diagnosis not present

## 2018-01-31 DIAGNOSIS — Z719 Counseling, unspecified: Secondary | ICD-10-CM | POA: Diagnosis not present

## 2018-02-06 ENCOUNTER — Ambulatory Visit: Payer: Self-pay | Admitting: Nurse Practitioner

## 2018-02-08 ENCOUNTER — Encounter: Payer: Self-pay | Admitting: Nurse Practitioner

## 2018-02-08 ENCOUNTER — Ambulatory Visit (INDEPENDENT_AMBULATORY_CARE_PROVIDER_SITE_OTHER): Payer: 59 | Admitting: Nurse Practitioner

## 2018-02-08 VITALS — BP 141/88 | HR 91 | Resp 16 | Ht 64.0 in | Wt 253.0 lb

## 2018-02-08 DIAGNOSIS — Z1239 Encounter for other screening for malignant neoplasm of breast: Secondary | ICD-10-CM | POA: Diagnosis not present

## 2018-02-08 DIAGNOSIS — F5101 Primary insomnia: Secondary | ICD-10-CM

## 2018-02-08 DIAGNOSIS — E669 Obesity, unspecified: Secondary | ICD-10-CM

## 2018-02-08 DIAGNOSIS — F411 Generalized anxiety disorder: Secondary | ICD-10-CM | POA: Diagnosis not present

## 2018-02-08 MED ORDER — ZOLPIDEM TARTRATE 10 MG PO TABS: 10.0000 mg | ORAL_TABLET | Freq: Every day | ORAL | 3 refills | Status: DC

## 2018-02-08 MED ORDER — PHENTERMINE HCL 37.5 MG PO TABS: 37.5000 mg | ORAL_TABLET | Freq: Every day | ORAL | 0 refills | Status: DC

## 2018-02-08 MED ORDER — ALPRAZOLAM 0.5 MG PO TABS: ORAL_TABLET | ORAL | 3 refills | Status: DC

## 2018-02-08 NOTE — Progress Notes (Signed)
[facility] [place], [place] [address]  Internal MEDICINE  Office Visit Note  Patient Name: Brooke Schultz  [id number]  [id number]  Date of Service: 02/14/2018  Chief Complaint  Patient presents with  . Hypertension  . Medical Management of Chronic Issues    weight managment  . Insomnia    The patient states that she is having intermittent hot flashes. Improved since her last visit when she was started on fluoxetine. Improved mood. She is unhappy with 16 pound weight gain since she was last seen. Has not really been taking appetite suppressant. Admits to poor diet choices and lack of exercise contributing to her weight gain. Has taken phentermine in the past and has done well on this medication. She lost weight without negative side effects. She would like to restart this medication.  She is due to have screening mammogram. She will need to have refills of routien medications.       Current Medication: Outpatient Encounter Medications as of 02/08/2018  Medication Sig  . Acetaminophen (TYLENOL EXTRA STRENGTH PO) Take 2 tablets by mouth as needed.  . ALPRAZolam (XANAX) 0.5 MG tablet Take 1 tablet po BID prn  . Choline Fenofibrate (FENOFIBRIC ACID) 135 MG CPDR Take 1 capsule by mouth every evening.  Marland Kitchen doxycycline (VIBRAMYCIN) 100 MG capsule Take 1 capsule (100 mg total) by mouth 2 (two) times daily.  . Fluoxetine HCl, PMDD, 10 MG CAPS Take 1 capsule (10 mg total) by mouth daily.  Marland Kitchen lisinopril (PRINIVIL,ZESTRIL) 20 MG tablet Take 1 tablet (20 mg total) by mouth daily.  . meclizine (ANTIVERT) 12.5 MG tablet Take 12.5 mg by mouth 3 (three) times daily.  . Multiple Vitamin (MULTIVITAMIN) tablet Take 1 tablet by mouth daily.  . Nebivolol HCl (BYSTOLIC) 20 MG TABS Take 1 tab po daily  . phentermine (ADIPEX-P) 37.5 MG tablet Take 1 tablet (37.5 mg total) by mouth daily before breakfast.  . polyethylene glycol-electrolytes (NULYTELY/GOLYTELY) 420 g solution U UTD  .  zolpidem (AMBIEN) 10 MG tablet Take 1 tablet (10 mg total) by mouth at bedtime.  . [DISCONTINUED] ALPRAZolam (XANAX) 0.5 MG tablet Take 1 tablet po BID prn  . [DISCONTINUED] phentermine (ADIPEX-P) 37.5 MG tablet Take 1 tablet (37.5 mg total) by mouth daily before breakfast.  . [DISCONTINUED] zolpidem (AMBIEN) 10 MG tablet Take 1 tablet (10 mg total) by mouth at bedtime.   No facility-administered encounter medications on file as of 02/08/2018.     Surgical History: Past Surgical History:  Procedure Laterality Date  . ABDOMINAL HYSTERECTOMY    . COLONOSCOPY    . EYE SURGERY    . GALLBLADDER SURGERY    . OVARIAN CYST REMOVAL    . TUBAL LIGATION      Medical History: Past Medical History:  Diagnosis Date  . Hyperlipidemia   . Hypertension   . Insomnia     Family History: Family History  Problem Relation Age of Onset  . Hypertension Mother     Social History   Socioeconomic History  . Marital status: Married    Spouse name: Not on file  . Number of children: Not on file  . Years of education: Not on file  . Highest education level: Not on file  Occupational History  . Not on file  Social Needs  . Financial resource strain: Not on file  . Food insecurity:    Worry: Not on file    Inability: Not on file  . Transportation needs:  Medical: Not on file    Non-medical: Not on file  Tobacco Use  . Smoking status: Former Smoker    Types: Cigarettes  . Smokeless tobacco: Never Used  . Tobacco comment: been smoke free for 7weeks  Substance and Sexual Activity  . Alcohol use: No  . Drug use: No  . Sexual activity: Not on file  Lifestyle  . Physical activity:    Days per week: Not on file    Minutes per session: Not on file  . Stress: Not on file  Relationships  . Social connections:    Talks on phone: Not on file    Gets together: Not on file    Attends religious service: Not on file    Active member of club or organization: Not on file    Attends meetings of  clubs or organizations: Not on file    Relationship status: Not on file  . Intimate partner violence:    Fear of current or ex partner: Not on file    Emotionally abused: Not on file    Physically abused: Not on file    Forced sexual activity: Not on file  Other Topics Concern  . Not on file  Social History Narrative  . Not on file      Review of Systems  Constitutional: Positive for unexpected weight change. Negative for activity change, chills and fatigue.       Weight gain 16 pounds since her last visit.   HENT: Negative for congestion, postnasal drip, rhinorrhea, sneezing and sore throat.   Respiratory: Negative for cough, chest tightness, shortness of breath and wheezing.   Cardiovascular: Negative for chest pain and palpitations.  Gastrointestinal: Negative for abdominal pain, constipation, diarrhea, nausea and vomiting.  Endocrine: Positive for heat intolerance. Negative for cold intolerance, polydipsia, polyphagia and polyuria.       Improved hot flashes since last visit .  Musculoskeletal: Negative for arthralgias, back pain, joint swelling and neck pain.  Skin: Negative for rash.  Allergic/Immunologic: Negative for environmental allergies.  Neurological: Negative for dizziness, tremors, weakness, numbness and headaches.  Hematological: Negative for adenopathy. Does not bruise/bleed easily.  Psychiatric/Behavioral: Positive for sleep disturbance. Negative for behavioral problems (Depression) and suicidal ideas. The patient is nervous/anxious.     Today's Vitals   02/08/18 1418  BP: (!) 141/88  Pulse: 91  Resp: 16  SpO2: 99%  Weight: 253 lb (114.8 kg)  Height: 5\' 4"  (1.626 m)   Body mass index is 43.43 kg/m.  Physical Exam Vitals signs and nursing note reviewed.  Constitutional:      General: She is not in acute distress.    Appearance: She is well-developed. She is obese. She is not diaphoretic.  HENT:     Head: Normocephalic and atraumatic.     Nose: Nose  normal.     Mouth/Throat:     Mouth: Mucous membranes are moist.     Pharynx: Oropharynx is clear. No oropharyngeal exudate.  Eyes:     Conjunctiva/sclera: Conjunctivae normal.     Pupils: Pupils are equal, round, and reactive to light.  Neck:     Musculoskeletal: Normal range of motion and neck supple.     Thyroid: No thyromegaly.     Vascular: No carotid bruit or JVD.     Trachea: No tracheal deviation.  Cardiovascular:     Rate and Rhythm: Normal rate and regular rhythm.     Heart sounds: Normal heart sounds. No murmur. No friction rub. No gallop.  Pulmonary:     Effort: Pulmonary effort is normal. No respiratory distress.     Breath sounds: Normal breath sounds. No wheezing or rales.  Chest:     Chest wall: No tenderness.  Abdominal:     General: Bowel sounds are normal.     Palpations: Abdomen is soft.     Tenderness: There is no abdominal tenderness.  Musculoskeletal: Normal range of motion.  Lymphadenopathy:     Cervical: No cervical adenopathy.  Skin:    General: Skin is warm and dry.     Capillary Refill: Capillary refill takes less than 2 seconds.  Neurological:     Mental Status: She is alert and oriented to person, place, and time.     Cranial Nerves: No cranial nerve deficit.  Psychiatric:        Behavior: Behavior normal.        Thought Content: Thought content normal.        Judgment: Judgment normal.    [facility] [place], [place] [address]  Internal MEDICINE  Office Visit Note  Patient Name: Brooke Schultz  [id number]  [id number]  Date of Service: 02/14/2018  Chief Complaint  Patient presents with  . Hypertension  . Medical Management of Chronic Issues    weight managment  . Insomnia    The patient states that she is having intermittent, but severe hot flashes. Gets drenched in hair and face with sweat. Gets very red in the face. These instances last for about 30 minutes or so and then resolve. She was on fluoxetine  at one point, but has not taken this in some time .She notices this is worse when she is out at the store or somewhere in public.       Current Medication: Outpatient Encounter Medications as of 02/08/2018  Medication Sig  . Acetaminophen (TYLENOL EXTRA STRENGTH PO) Take 2 tablets by mouth as needed.  . ALPRAZolam (XANAX) 0.5 MG tablet Take 1 tablet po BID prn  . Choline Fenofibrate (FENOFIBRIC ACID) 135 MG CPDR Take 1 capsule by mouth every evening.  Marland Kitchen doxycycline (VIBRAMYCIN) 100 MG capsule Take 1 capsule (100 mg total) by mouth 2 (two) times daily.  . Fluoxetine HCl, PMDD, 10 MG CAPS Take 1 capsule (10 mg total) by mouth daily.  Marland Kitchen lisinopril (PRINIVIL,ZESTRIL) 20 MG tablet Take 1 tablet (20 mg total) by mouth daily.  . meclizine (ANTIVERT) 12.5 MG tablet Take 12.5 mg by mouth 3 (three) times daily.  . Multiple Vitamin (MULTIVITAMIN) tablet Take 1 tablet by mouth daily.  . Nebivolol HCl (BYSTOLIC) 20 MG TABS Take 1 tab po daily  . phentermine (ADIPEX-P) 37.5 MG tablet Take 1 tablet (37.5 mg total) by mouth daily before breakfast.  . polyethylene glycol-electrolytes (NULYTELY/GOLYTELY) 420 g solution U UTD  . zolpidem (AMBIEN) 10 MG tablet Take 1 tablet (10 mg total) by mouth at bedtime.  . [DISCONTINUED] ALPRAZolam (XANAX) 0.5 MG tablet Take 1 tablet po BID prn  . [DISCONTINUED] phentermine (ADIPEX-P) 37.5 MG tablet Take 1 tablet (37.5 mg total) by mouth daily before breakfast.  . [DISCONTINUED] zolpidem (AMBIEN) 10 MG tablet Take 1 tablet (10 mg total) by mouth at bedtime.   No facility-administered encounter medications on file as of 02/08/2018.     Surgical History: Past Surgical History:  Procedure Laterality Date  . ABDOMINAL HYSTERECTOMY    . COLONOSCOPY    . EYE SURGERY    . GALLBLADDER SURGERY    . OVARIAN CYST REMOVAL    .  TUBAL LIGATION      Medical History: Past Medical History:  Diagnosis Date  . Hyperlipidemia   . Hypertension   . Insomnia     Family  History: Family History  Problem Relation Age of Onset  . Hypertension Mother     Social History   Socioeconomic History  . Marital status: Married    Spouse name: Not on file  . Number of children: Not on file  . Years of education: Not on file  . Highest education level: Not on file  Occupational History  . Not on file  Social Needs  . Financial resource strain: Not on file  . Food insecurity:    Worry: Not on file    Inability: Not on file  . Transportation needs:    Medical: Not on file    Non-medical: Not on file  Tobacco Use  . Smoking status: Former Smoker    Types: Cigarettes  . Smokeless tobacco: Never Used  . Tobacco comment: been smoke free for 7weeks  Substance and Sexual Activity  . Alcohol use: No  . Drug use: No  . Sexual activity: Not on file  Lifestyle  . Physical activity:    Days per week: Not on file    Minutes per session: Not on file  . Stress: Not on file  Relationships  . Social connections:    Talks on phone: Not on file    Gets together: Not on file    Attends religious service: Not on file    Active member of club or organization: Not on file    Attends meetings of clubs or organizations: Not on file    Relationship status: Not on file  . Intimate partner violence:    Fear of current or ex partner: Not on file    Emotionally abused: Not on file    Physically abused: Not on file    Forced sexual activity: Not on file  Other Topics Concern  . Not on file  Social History Narrative  . Not on file      Review of Systems  Constitutional: Positive for fatigue. Negative for activity change, chills and unexpected weight change.  HENT: Negative for congestion, postnasal drip, rhinorrhea, sneezing and sore throat.   Eyes: Negative.  Negative for redness.  Respiratory: Negative for cough, chest tightness, shortness of breath and wheezing.   Cardiovascular: Negative for chest pain and palpitations.  Gastrointestinal: Negative for  abdominal pain, constipation, diarrhea, nausea and vomiting.  Endocrine: Negative for cold intolerance, heat intolerance, polydipsia, polyphagia and polyuria.  Genitourinary: Negative.  Negative for dysuria and frequency.  Musculoskeletal: Negative for arthralgias, back pain, joint swelling and neck pain.  Skin: Negative for rash.  Allergic/Immunologic: Negative for environmental allergies.  Neurological: Negative for dizziness, tremors, weakness, numbness and headaches.  Hematological: Negative for adenopathy. Does not bruise/bleed easily.  Psychiatric/Behavioral: Positive for sleep disturbance. Negative for behavioral problems (Depression) and suicidal ideas. The patient is nervous/anxious.     Today's Vitals   02/08/18 1418  BP: (!) 141/88  Pulse: 91  Resp: 16  SpO2: 99%  Weight: 253 lb (114.8 kg)  Height: 5\' 4"  (1.626 m)    Physical Exam  Constitutional: She is oriented to person, place, and time. She appears well-developed and well-nourished. No distress.  HENT:  Head: Normocephalic and atraumatic.  Nose: Nose normal.  Mouth/Throat: Oropharynx is clear and moist. No oropharyngeal exudate.  Eyes: Pupils are equal, round, and reactive to light. Conjunctivae and EOM are  normal.  Neck: Normal range of motion. Neck supple. No JVD present. Carotid bruit is not present. No tracheal deviation present. No thyromegaly present.  Cardiovascular: Normal rate, regular rhythm and normal heart sounds. Exam reveals no gallop and no friction rub.  No murmur heard. Pulmonary/Chest: Effort normal and breath sounds normal. No respiratory distress. She has no wheezes. She has no rales. She exhibits no tenderness.  Abdominal: Soft. Bowel sounds are normal. There is no tenderness.  Musculoskeletal: Normal range of motion.  Lymphadenopathy:    She has no cervical adenopathy.  Neurological: She is alert and oriented to person, place, and time. No cranial nerve deficit.  Skin: Skin is warm and dry.  Capillary refill takes less than 2 seconds. She is not diaphoretic.  Psychiatric: She has a normal mood and affect. Her behavior is normal. Judgment and thought content normal.  Nursing note and vitals reviewed.  Assessment/Plan: 1. Essential hypertension Stable. Continue bp medication as prescribed  2. GAD (generalized anxiety disorder) Restart fluoxetine 10mg  daily. May continue to take alprazolam 0.5mg  up to twice daily if needed for acute anxiety. New prescription provided today.  - ALPRAZolam (XANAX) 0.5 MG tablet; Take 1 tablet po BID prn  Dispense: 60 tablet; Refill: 2 - Fluoxetine HCl, PMDD, 10 MG CAPS; Take 1 capsule (10 mg total) by mouth daily.  Dispense: 30 capsule; Refill: 3  3. B12 deficiency Vitamin b12 injection administered today.  - cyanocobalamin ((VITAMIN B-12)) injection 1,000 mcg  4. Mild obesity Restart phentermine 37.5mg  tablets daily. Limit calorie intake to 1200-1500 calories per day. Recommended she participate in low-impact, low-intensity, cardiovascular activity to help with weight loss.  - phentermine (ADIPEX-P) 37.5 MG tablet; Take 1 tablet (37.5 mg total) by mouth daily before breakfast.  Dispense: 30 tablet; Refill: 0  5. Primary insomnia May continue ambien 10mg  at bedtime as needed for insomnia. New prescription provided today.  - zolpidem (AMBIEN) 10 MG tablet; Take 1 tablet (10 mg total) by mouth at bedtime.  Dispense: 30 tablet; Refill: 3  6. Flu vaccine need - Flu Vaccine MDCK QUAD PF  General Counseling: Ambry verbalizes understanding of the findings of todays visit and agrees with plan of treatment. I have discussed any further diagnostic evaluation that may be needed or ordered today. We also reviewed her medications today. she has been encouraged to call the office with any questions or concerns that should arise related to todays visit.   There is a liability release in patients' chart. There has been a 10 minute discussion about the side  effects including but not limited to elevated blood pressure, anxiety, lack of sleep and dry mouth. Pt understands and will like to start/continue on appetite suppressant at this time. There will be one month RX given at the time of visit with proper follow up. Nova diet plan with restricted calories is given to the pt. Pt understands and agrees with  plan of treatment  This patient was seen by Leretha Pol FNP Collaboration with Dr Lavera Guise as a part of collaborative care agreement  Orders Placed This Encounter  Procedures  . MM DIGITAL SCREENING BILATERAL    Meds ordered this encounter  Medications  . ALPRAZolam (XANAX) 0.5 MG tablet    Sig: Take 1 tablet po BID prn    Dispense:  60 tablet    Refill:  3    Order Specific Question:   Supervising Provider    Answer:   Lavera Guise [place]  . zolpidem (AMBIEN)  10 MG tablet    Sig: Take 1 tablet (10 mg total) by mouth at bedtime.    Dispense:  30 tablet    Refill:  3    Patient would like to fill as 30 day prescription please.    Order Specific Question:   Supervising Provider    Answer:   Lavera Guise [[id number]]  . phentermine (ADIPEX-P) 37.5 MG tablet    Sig: Take 1 tablet (37.5 mg total) by mouth daily before breakfast.    Dispense:  30 tablet    Refill:  0    Order Specific Question:   Supervising Provider    Answer:   Lavera Guise [[id number]]    Time spent: 25 Minutes

## 2018-02-09 ENCOUNTER — Telehealth: Payer: Self-pay | Admitting: Nurse Practitioner

## 2018-02-09 NOTE — Telephone Encounter (Signed)
Phentermine HCl 37.5MG  tablets Approvedtoday CaseId:[id number];Status:Approved;Review Type:Prior Auth;Coverage Start Date:01/10/2018;Coverage End Date:02/09/2019; Pharmacy notified

## 2018-02-28 DIAGNOSIS — J019 Acute sinusitis, unspecified: Secondary | ICD-10-CM | POA: Diagnosis not present

## 2018-04-03 DIAGNOSIS — J019 Acute sinusitis, unspecified: Secondary | ICD-10-CM | POA: Diagnosis not present

## 2018-04-16 ENCOUNTER — Ambulatory Visit: Payer: Self-pay | Admitting: Nurse Practitioner

## 2018-04-27 ENCOUNTER — Other Ambulatory Visit: Payer: Self-pay | Admitting: Nurse Practitioner

## 2018-04-27 MED ORDER — LISINOPRIL 20 MG PO TABS: 20.0000 mg | ORAL_TABLET | Freq: Every day | ORAL | 2 refills | Status: DC

## 2018-05-25 ENCOUNTER — Other Ambulatory Visit: Payer: Self-pay

## 2018-05-25 ENCOUNTER — Encounter: Payer: Self-pay | Admitting: Nurse Practitioner

## 2018-05-25 ENCOUNTER — Ambulatory Visit (INDEPENDENT_AMBULATORY_CARE_PROVIDER_SITE_OTHER): Payer: 59 | Admitting: Nurse Practitioner

## 2018-05-25 VITALS — HR 63 | Resp 16 | Ht 64.0 in | Wt 240.0 lb

## 2018-05-25 DIAGNOSIS — F5101 Primary insomnia: Secondary | ICD-10-CM | POA: Diagnosis not present

## 2018-05-25 DIAGNOSIS — I10 Essential (primary) hypertension: Secondary | ICD-10-CM

## 2018-05-25 DIAGNOSIS — Z1239 Encounter for other screening for malignant neoplasm of breast: Secondary | ICD-10-CM | POA: Diagnosis not present

## 2018-05-25 MED ORDER — ZOLPIDEM TARTRATE 10 MG PO TABS: 10.0000 mg | ORAL_TABLET | Freq: Every day | ORAL | 3 refills | Status: DC

## 2018-05-25 NOTE — Progress Notes (Signed)
[facility] [place], [place] [address]  Internal MEDICINE  Telephone Visit  Patient Name: Brooke Schultz  [id number]  [id number]  Date of Service: 06/10/2018  I connected with the patient at 3:16pm by webcam and verified the patients identity using two identifiers.   I discussed the limitations, risks, security and privacy concerns of performing an evaluation and management service by webcam and the availability of in person appointments. I also discussed with the patient that there may be a patient responsible charge related to the service.  The patient expressed understanding and agrees to proceed.    Chief Complaint  Patient presents with  . Telephone Assessment  . Telephone Screen  . Hypertension    The patient has been contacted via webcam for follow up visit due to concerns for spread of novel coronavirus. She needs to have refills for her zolpidem today. She is due to have screening mammogram.  Hypertension  This is a chronic problem. The current episode started more than 1 year ago. The problem is unchanged. The problem is controlled. Associated symptoms include shortness of breath. Pertinent negatives include no chest pain, neck pain or palpitations. There are no associated agents to hypertension. Risk factors for coronary artery disease include dyslipidemia, diabetes mellitus and obesity. Past treatments include ACE inhibitors and beta blockers. The current treatment provides moderate improvement. Compliance problems include exercise and diet.        Current Medication: Outpatient Encounter Medications as of 05/25/2018  Medication Sig  . Acetaminophen (TYLENOL EXTRA STRENGTH PO) Take 2 tablets by mouth as needed.  . ALPRAZolam (XANAX) 0.5 MG tablet Take 1 tablet po BID prn  . Choline Fenofibrate (FENOFIBRIC ACID) 135 MG CPDR Take 1 capsule by mouth every evening.  . Fluoxetine HCl, PMDD, 10 MG CAPS Take 1 capsule (10 mg total) by mouth daily.  Marland Kitchen  lisinopril (ZESTRIL) 20 MG tablet Take 1 tablet (20 mg total) by mouth daily.  . meclizine (ANTIVERT) 12.5 MG tablet Take 12.5 mg by mouth 3 (three) times daily.  . Multiple Vitamin (MULTIVITAMIN) tablet Take 1 tablet by mouth daily.  . Nebivolol HCl (BYSTOLIC) 20 MG TABS Take 1 tab po daily  . polyethylene glycol-electrolytes (NULYTELY/GOLYTELY) 420 g solution U UTD  . zolpidem (AMBIEN) 10 MG tablet Take 1 tablet (10 mg total) by mouth at bedtime.  . [DISCONTINUED] zolpidem (AMBIEN) 10 MG tablet Take 1 tablet (10 mg total) by mouth at bedtime.  Marland Kitchen doxycycline (VIBRAMYCIN) 100 MG capsule Take 1 capsule (100 mg total) by mouth 2 (two) times daily.  . phentermine (ADIPEX-P) 37.5 MG tablet Take 1 tablet (37.5 mg total) by mouth daily before breakfast. (Patient not taking: Reported on 05/25/2018)   No facility-administered encounter medications on file as of 05/25/2018.     Surgical History: Past Surgical History:  Procedure Laterality Date  . ABDOMINAL HYSTERECTOMY    . COLONOSCOPY    . EYE SURGERY    . GALLBLADDER SURGERY    . OVARIAN CYST REMOVAL    . TUBAL LIGATION      Medical History: Past Medical History:  Diagnosis Date  . Hyperlipidemia   . Hypertension   . Insomnia     Family History: Family History  Problem Relation Age of Onset  . Hypertension Mother     Social History   Socioeconomic History  . Marital status: Married    Spouse name: Not on file  . Number of children: Not on file  . Years of education:  Not on file  . Highest education level: Not on file  Occupational History  . Not on file  Social Needs  . Financial resource strain: Not on file  . Food insecurity:    Worry: Not on file    Inability: Not on file  . Transportation needs:    Medical: Not on file    Non-medical: Not on file  Tobacco Use  . Smoking status: Former Smoker    Types: Cigarettes  . Smokeless tobacco: Never Used  . Tobacco comment: been smoke free for 7weeks  Substance and  Sexual Activity  . Alcohol use: No  . Drug use: No  . Sexual activity: Not on file  Lifestyle  . Physical activity:    Days per week: Not on file    Minutes per session: Not on file  . Stress: Not on file  Relationships  . Social connections:    Talks on phone: Not on file    Gets together: Not on file    Attends religious service: Not on file    Active member of club or organization: Not on file    Attends meetings of clubs or organizations: Not on file    Relationship status: Not on file  . Intimate partner violence:    Fear of current or ex partner: Not on file    Emotionally abused: Not on file    Physically abused: Not on file    Forced sexual activity: Not on file  Other Topics Concern  . Not on file  Social History Narrative  . Not on file      Review of Systems  Constitutional: Positive for fatigue. Negative for activity change, chills and unexpected weight change.  HENT: Negative for congestion, postnasal drip, rhinorrhea, sneezing and sore throat.   Eyes: Negative.  Negative for redness.  Respiratory: Positive for shortness of breath. Negative for cough, chest tightness and wheezing.   Cardiovascular: Negative for chest pain and palpitations.  Gastrointestinal: Negative for abdominal pain, constipation, diarrhea, nausea and vomiting.  Endocrine: Negative for cold intolerance, heat intolerance, polydipsia and polyuria.  Musculoskeletal: Negative for arthralgias, back pain, joint swelling, myalgias and neck pain.  Skin: Negative for rash.  Allergic/Immunologic: Positive for environmental allergies.  Neurological: Negative for tremors, weakness and numbness.  Hematological: Negative for adenopathy. Does not bruise/bleed easily.  Psychiatric/Behavioral: Positive for sleep disturbance. Negative for behavioral problems (Depression) and suicidal ideas. The patient is nervous/anxious.     Today's Vitals   05/25/18 1454  Pulse: 63  Resp: 16  SpO2: 98%  Weight: 240  lb (108.9 kg)  Height: 5\' 4"  (1.626 m)   Body mass index is 41.2 kg/m.  Observation/Objective:   The patient is alert and oriented. She is pleasant and answers all questions appropriately. Breathing is non-labored. She is in no acute distress at this time.    Assessment/Plan: 1. Essential hypertension Generally stable. Continue bp medication as prescribed   2. Primary insomnia May continue ambien 10mg  at bedtime as needed for acute insomnia. Refills provided today.  - zolpidem (AMBIEN) 10 MG tablet; Take 1 tablet (10 mg total) by mouth at bedtime.  Dispense: 30 tablet; Refill: 3  3. Screening for breast cancer - MM DIGITAL SCREENING BILATERAL; Future  General Counseling: timiya howells understanding of the findings of today's phone visit and agrees with plan of treatment. I have discussed any further diagnostic evaluation that may be needed or ordered today. We also reviewed her medications today. she has been encouraged to  call the office with any questions or concerns that should arise related to todays visit.  Hypertension Counseling:   The following hypertensive lifestyle modification were recommended and discussed:  1. Limiting alcohol intake to less than 1 oz/day of ethanol:(24 oz of beer or 8 oz of wine or 2 oz of 100-proof whiskey). 2. Take baby ASA 81 mg daily. 3. Importance of regular aerobic exercise and losing weight. 4. Reduce dietary saturated fat and cholesterol intake for overall cardiovascular health. 5. Maintaining adequate dietary potassium, calcium, and magnesium intake. 6. Regular monitoring of the blood pressure. 7. Reduce sodium intake to less than 100 mmol/day (less than 2.3 gm of sodium or less than 6 gm of sodium choride)   This patient was seen by [place] with Dr Lavera Guise as a part of collaborative care agreement  Orders Placed This Encounter  Procedures  . MM DIGITAL SCREENING BILATERAL    Meds ordered this  encounter  Medications  . zolpidem (AMBIEN) 10 MG tablet    Sig: Take 1 tablet (10 mg total) by mouth at bedtime.    Dispense:  30 tablet    Refill:  3    Patient would like to fill as 30 day prescription please.    Order Specific Question:   Supervising Provider    Answer:   Lavera Guise [[id number]    Time spent: 57 Minutes    Dr Lavera Guise Internal medicine

## 2018-06-10 DIAGNOSIS — Z1239 Encounter for other screening for malignant neoplasm of breast: Secondary | ICD-10-CM | POA: Insufficient documentation

## 2018-06-25 ENCOUNTER — Other Ambulatory Visit: Payer: Self-pay

## 2018-06-25 MED ORDER — LISINOPRIL 20 MG PO TABS: 20.0000 mg | ORAL_TABLET | Freq: Every day | ORAL | 2 refills | Status: DC

## 2018-07-24 ENCOUNTER — Other Ambulatory Visit: Payer: Self-pay

## 2018-07-24 DIAGNOSIS — E782 Mixed hyperlipidemia: Secondary | ICD-10-CM

## 2018-07-24 MED ORDER — FENOFIBRIC ACID 135 MG PO CPDR: 1.0000 | DELAYED_RELEASE_CAPSULE | Freq: Every evening | ORAL | 1 refills | Status: DC

## 2018-07-24 MED ORDER — BYSTOLIC 20 MG PO TABS: ORAL_TABLET | ORAL | 1 refills | Status: DC

## 2018-08-27 ENCOUNTER — Ambulatory Visit: Payer: 59 | Admitting: Nurse Practitioner

## 2018-08-28 ENCOUNTER — Encounter: Payer: Self-pay | Admitting: Nurse Practitioner

## 2018-08-28 ENCOUNTER — Ambulatory Visit: Payer: 59 | Admitting: Nurse Practitioner

## 2018-08-28 ENCOUNTER — Other Ambulatory Visit: Payer: Self-pay

## 2018-08-28 VITALS — Temp 98.2°F | Ht 64.0 in | Wt 247.0 lb

## 2018-08-28 DIAGNOSIS — I10 Essential (primary) hypertension: Secondary | ICD-10-CM | POA: Diagnosis not present

## 2018-08-28 DIAGNOSIS — F411 Generalized anxiety disorder: Secondary | ICD-10-CM

## 2018-08-28 DIAGNOSIS — F5101 Primary insomnia: Secondary | ICD-10-CM

## 2018-08-28 MED ORDER — ZOLPIDEM TARTRATE 10 MG PO TABS: 10.0000 mg | ORAL_TABLET | Freq: Every day | ORAL | 3 refills | Status: DC

## 2018-08-28 MED ORDER — ALPRAZOLAM 0.5 MG PO TABS: ORAL_TABLET | ORAL | 3 refills | Status: DC

## 2018-08-28 NOTE — Progress Notes (Signed)
[facility] [place], [place] [address]  Internal MEDICINE  Telephone Visit  Patient Name: Brooke Schultz  [id number]  ID:[id number]  Date of Service: 08/29/2018  I connected with the patient at 12:56pm by webcam and verified the patients identity using two identifiers.   I discussed the limitations, risks, security and privacy concerns of performing an evaluation and management service by webcam and the availability of in person appointments. I also discussed with the patient that there may be a patient responsible charge related to the service.  The patient expressed understanding and agrees to proceed.    Chief Complaint  Patient presents with  . Telephone Assessment  . Telephone Screen  . Medication Refill    XANAX  . Hyperlipidemia  . Hypertension    The patient has been contacted via webcam for follow up visit due to concerns for spread of novel coronavirus.  She needs to have refills for her zolpidem today. Hypertension This is a chronic problem. The problem is unchanged. The problem is controlled. Associated symptoms include shortness of breath. Pertinent negatives include no chest pain, neck pain or palpitations. Risk factors for coronary artery disease include obesity, sedentary lifestyle and post-menopausal state. Past treatments include ACE inhibitors and beta blockers. The current treatment provides moderate improvement. Compliance problems include exercise.        Current Medication: Outpatient Encounter Medications as of 08/28/2018  Medication Sig  . Acetaminophen (TYLENOL EXTRA STRENGTH PO) Take 2 tablets by mouth as needed.  . ALPRAZolam (XANAX) 0.5 MG tablet Take 1 tablet po BID prn  . Choline Fenofibrate (FENOFIBRIC ACID) 135 MG CPDR Take 1 capsule by mouth every evening.  Marland Kitchen lisinopril (ZESTRIL) 20 MG tablet Take 1 tablet (20 mg total) by mouth daily.  . meclizine (ANTIVERT) 12.5 MG tablet Take 12.5 mg by mouth 3 (three) times daily.  .  Multiple Vitamin (MULTIVITAMIN) tablet Take 1 tablet by mouth daily.  . Nebivolol HCl (BYSTOLIC) 20 MG TABS Take 1 tab po daily  . phentermine (ADIPEX-P) 37.5 MG tablet Take 1 tablet (37.5 mg total) by mouth daily before breakfast.  . zolpidem (AMBIEN) 10 MG tablet Take 1 tablet (10 mg total) by mouth at bedtime.  . [DISCONTINUED] ALPRAZolam (XANAX) 0.5 MG tablet Take 1 tablet po BID prn  . [DISCONTINUED] zolpidem (AMBIEN) 10 MG tablet Take 1 tablet (10 mg total) by mouth at bedtime.  . [DISCONTINUED] doxycycline (VIBRAMYCIN) 100 MG capsule Take 1 capsule (100 mg total) by mouth 2 (two) times daily. (Patient not taking: Reported on 08/28/2018)  . [DISCONTINUED] Fluoxetine HCl, PMDD, 10 MG CAPS Take 1 capsule (10 mg total) by mouth daily. (Patient not taking: Reported on 08/28/2018)  . [DISCONTINUED] polyethylene glycol-electrolytes (NULYTELY/GOLYTELY) 420 g solution U UTD   No facility-administered encounter medications on file as of 08/28/2018.     Surgical History: Past Surgical History:  Procedure Laterality Date  . ABDOMINAL HYSTERECTOMY    . COLONOSCOPY    . EYE SURGERY    . GALLBLADDER SURGERY    . OVARIAN CYST REMOVAL    . TUBAL LIGATION      Medical History: Past Medical History:  Diagnosis Date  . Hyperlipidemia   . Hypertension   . Insomnia     Family History: Family History  Problem Relation Age of Onset  . Hypertension Mother     Social History   Socioeconomic History  . Marital status: Married    Spouse name: Not on file  . Number of  children: Not on file  . Years of education: Not on file  . Highest education level: Not on file  Occupational History  . Not on file  Social Needs  . Financial resource strain: Not on file  . Food insecurity    Worry: Not on file    Inability: Not on file  . Transportation needs    Medical: Not on file    Non-medical: Not on file  Tobacco Use  . Smoking status: Former Smoker    Types: Cigarettes  . Smokeless tobacco:  Never Used  . Tobacco comment: been smoke free for 7weeks  Substance and Sexual Activity  . Alcohol use: No  . Drug use: No  . Sexual activity: Not on file  Lifestyle  . Physical activity    Days per week: Not on file    Minutes per session: Not on file  . Stress: Not on file  Relationships  . Social Herbalist on phone: Not on file    Gets together: Not on file    Attends religious service: Not on file    Active member of club or organization: Not on file    Attends meetings of clubs or organizations: Not on file    Relationship status: Not on file  . Intimate partner violence    Fear of current or ex partner: Not on file    Emotionally abused: Not on file    Physically abused: Not on file    Forced sexual activity: Not on file  Other Topics Concern  . Not on file  Social History Narrative  . Not on file      Review of Systems  Constitutional: Positive for fatigue. Negative for activity change, chills and unexpected weight change.  HENT: Negative for congestion, postnasal drip, rhinorrhea, sneezing and sore throat.   Eyes: Negative.  Negative for redness.  Respiratory: Positive for shortness of breath. Negative for cough, chest tightness and wheezing.   Cardiovascular: Negative for chest pain and palpitations.  Gastrointestinal: Negative for abdominal pain, constipation, diarrhea, nausea and vomiting.  Endocrine: Negative for cold intolerance, heat intolerance, polydipsia and polyuria.  Musculoskeletal: Negative for arthralgias, back pain, joint swelling, myalgias and neck pain.  Skin: Negative for rash.  Allergic/Immunologic: Positive for environmental allergies.  Neurological: Negative for tremors, weakness and numbness.  Hematological: Negative for adenopathy. Does not bruise/bleed easily.  Psychiatric/Behavioral: Positive for sleep disturbance. Negative for behavioral problems (Depression) and suicidal ideas. The patient is nervous/anxious.     Today's  Vitals   08/28/18 1034  Temp: 98.2 F (36.8 C)  Weight: 247 lb (112 kg)  Height: 5\' 4"  (1.626 m)   Body mass index is 42.4 kg/m.   Observation/Objective:   The patient is alert and oriented. She is pleasant and answers all questions appropriately. Breathing is non-labored. She is in no acute distress at this time.    Assessment/Plan: 1. Essential hypertension Stable. Continue bp medication as prescribed   2. GAD (generalized anxiety disorder) May continue alprazolam 0.5mg  twice daily as needed for acute anxiety. New prescription sent to her pharmacy.  - ALPRAZolam (XANAX) 0.5 MG tablet; Take 1 tablet po BID prn  Dispense: 60 tablet; Refill: 3  3. Primary insomnia May take ambien at bedtime as needed for insomnia. New prescription sent to her pharmacy.  - zolpidem (AMBIEN) 10 MG tablet; Take 1 tablet (10 mg total) by mouth at bedtime.  Dispense: 30 tablet; Refill: 3  General Counseling: Magda verbalizes understanding of  the findings of today's phone visit and agrees with plan of treatment. I have discussed any further diagnostic evaluation that may be needed or ordered today. We also reviewed her medications today. she has been encouraged to call the office with any questions or concerns that should arise related to todays visit.   This patient was seen by [place] with Dr Lavera Guise as a part of collaborative care agreement  Meds ordered this encounter  Medications  . ALPRAZolam (XANAX) 0.5 MG tablet    Sig: Take 1 tablet po BID prn    Dispense:  60 tablet    Refill:  3    Order Specific Question:   Supervising Provider    Answer:   Lavera Guise [place]  . zolpidem (AMBIEN) 10 MG tablet    Sig: Take 1 tablet (10 mg total) by mouth at bedtime.    Dispense:  30 tablet    Refill:  3    Patient would like to fill as 30 day prescription please.    Order Specific Question:   Supervising Provider    Answer:   Lavera Guise [id number]    Time spent:  99 Minutes    Dr Lavera Guise Internal medicine

## 2018-10-01 ENCOUNTER — Other Ambulatory Visit: Payer: Self-pay

## 2018-10-01 ENCOUNTER — Encounter: Payer: Self-pay | Admitting: Nurse Practitioner

## 2018-10-01 ENCOUNTER — Ambulatory Visit (INDEPENDENT_AMBULATORY_CARE_PROVIDER_SITE_OTHER): Payer: 59 | Admitting: Nurse Practitioner

## 2018-10-01 VITALS — BP 138/82 | HR 65 | Temp 97.6°F | Resp 16 | Ht 64.0 in | Wt 254.0 lb

## 2018-10-01 DIAGNOSIS — I10 Essential (primary) hypertension: Secondary | ICD-10-CM

## 2018-10-01 DIAGNOSIS — M25561 Pain in right knee: Secondary | ICD-10-CM | POA: Diagnosis not present

## 2018-10-01 DIAGNOSIS — Z23 Encounter for immunization: Secondary | ICD-10-CM | POA: Diagnosis not present

## 2018-10-01 DIAGNOSIS — R300 Dysuria: Secondary | ICD-10-CM

## 2018-10-01 DIAGNOSIS — Z0001 Encounter for general adult medical examination with abnormal findings: Secondary | ICD-10-CM

## 2018-10-01 MED ORDER — MELOXICAM 7.5 MG PO TABS: 7.5000 mg | ORAL_TABLET | Freq: Every day | ORAL | 1 refills | Status: DC

## 2018-10-01 NOTE — Progress Notes (Signed)
[facility] [place], [place] [address]  Internal MEDICINE  Office Visit Note  Patient Name: Brooke Schultz  [id number]  ID:[id number]  Date of Service: 10/13/2018   Pt is here for routine health maintenance examination   Chief Complaint  Patient presents with  . Annual Exam  . Hypertension  . Hyperlipidemia  . Knee Pain    pt fell down and had bad right knee pain     The patient is here for health maintenance exam. She states that she injured her right knee stepping off of a driveway. She states that the first four days were bad. Now gets very stiff when she has been sitting down for a long period of time and has to get up. Gets muscle spasms in her right calf. Gradually improving. Takes tylenol as needed. Tried wearing a knee sleeve, however, this was very uncomfortable. Blood pressure is slightly elevated today. Is generally well managed. Just had new granddaughter and is anxious about being out during [place] 19 pandemic.    Current Medication: Outpatient Encounter Medications as of 10/01/2018  Medication Sig  . Acetaminophen (TYLENOL EXTRA STRENGTH PO) Take 2 tablets by mouth as needed.  . ALPRAZolam (XANAX) 0.5 MG tablet Take 1 tablet po BID prn  . Choline Fenofibrate (FENOFIBRIC ACID) 135 MG CPDR Take 1 capsule by mouth every evening.  Marland Kitchen lisinopril (ZESTRIL) 20 MG tablet Take 1 tablet (20 mg total) by mouth daily.  . meclizine (ANTIVERT) 12.5 MG tablet Take 12.5 mg by mouth 3 (three) times daily.  . Multiple Vitamin (MULTIVITAMIN) tablet Take 1 tablet by mouth daily.  . Nebivolol HCl (BYSTOLIC) 20 MG TABS Take 1 tab po daily  . phentermine (ADIPEX-P) 37.5 MG tablet Take 1 tablet (37.5 mg total) by mouth daily before breakfast.  . zolpidem (AMBIEN) 10 MG tablet Take 1 tablet (10 mg total) by mouth at bedtime.  . meloxicam (MOBIC) 7.5 MG tablet Take 1 tablet (7.5 mg total) by mouth daily.   No facility-administered encounter medications on file as of  10/01/2018.     Surgical History: Past Surgical History:  Procedure Laterality Date  . ABDOMINAL HYSTERECTOMY    . COLONOSCOPY    . EYE SURGERY    . GALLBLADDER SURGERY    . OVARIAN CYST REMOVAL    . TUBAL LIGATION      Medical History: Past Medical History:  Diagnosis Date  . Hyperlipidemia   . Hypertension   . Insomnia     Family History: Family History  Problem Relation Age of Onset  . Hypertension Mother       Review of Systems  Constitutional: Positive for fatigue. Negative for activity change, chills and unexpected weight change.  HENT: Negative for congestion, postnasal drip, rhinorrhea, sneezing and sore throat.   Eyes: Negative.  Negative for redness.  Respiratory: Negative for cough, chest tightness, shortness of breath and wheezing.   Cardiovascular: Negative for chest pain and palpitations.  Gastrointestinal: Negative for abdominal pain, constipation, diarrhea, nausea and vomiting.  Endocrine: Negative for cold intolerance, heat intolerance, polydipsia and polyuria.  Musculoskeletal: Positive for arthralgias. Negative for back pain, joint swelling, myalgias and neck pain.       Pain in right knee.   Skin: Negative for rash.  Allergic/Immunologic: Positive for environmental allergies.  Neurological: Negative for tremors, weakness and numbness.  Hematological: Negative for adenopathy. Does not bruise/bleed easily.  Psychiatric/Behavioral: Positive for sleep disturbance. Negative for behavioral problems (Depression) and suicidal ideas. The patient is  nervous/anxious.      Today's Vitals   10/01/18 1506  BP: 138/82  Pulse: 65  Resp: 16  Temp: 97.6 F (36.4 C)  SpO2: 98%  Weight: 254 lb (115.2 kg)  Height: 5\' 4"  (1.626 m)   Body mass index is 43.6 kg/m.  Physical Exam Vitals signs and nursing note reviewed.  Constitutional:      General: She is not in acute distress.    Appearance: Normal appearance. She is well-developed. She is obese. She is  not diaphoretic.  HENT:     Head: Normocephalic and atraumatic.     Nose: Nose normal.     Mouth/Throat:     Mouth: Mucous membranes are moist.     Pharynx: Oropharynx is clear. No oropharyngeal exudate.  Eyes:     Conjunctiva/sclera: Conjunctivae normal.     Pupils: Pupils are equal, round, and reactive to light.  Neck:     Musculoskeletal: Normal range of motion and neck supple.     Thyroid: No thyromegaly.     Vascular: No carotid bruit or JVD.     Trachea: No tracheal deviation.  Cardiovascular:     Rate and Rhythm: Normal rate and regular rhythm.     Pulses: Normal pulses.     Heart sounds: Normal heart sounds. No murmur. No friction rub. No gallop.   Pulmonary:     Effort: Pulmonary effort is normal. No respiratory distress.     Breath sounds: Normal breath sounds. No wheezing or rales.  Chest:     Chest wall: No tenderness.  Abdominal:     General: Bowel sounds are normal.     Palpations: Abdomen is soft.     Tenderness: There is no abdominal tenderness.  Musculoskeletal: Normal range of motion.     Comments: Mild right knee painl mild swelling present. Hurts more when flexing and extending the right leg. Patient walking with mild limp, favoring the right side.   Lymphadenopathy:     Cervical: No cervical adenopathy.  Skin:    General: Skin is warm and dry.     Capillary Refill: Capillary refill takes less than 2 seconds.  Neurological:     Mental Status: She is alert and oriented to person, place, and time.     Cranial Nerves: No cranial nerve deficit.  Psychiatric:        Behavior: Behavior normal.        Thought Content: Thought content normal.        Judgment: Judgment normal.   Assessment/Plan: 1. Encounter for general adult medical examination with abnormal findings Annual health maintenance exam today   2. Acute pain of right knee Start meloxicam 7.5mg  daily as needed for pain/inflammation. Apply a compressive ACE bandage. Rest and elevate the affected  painful area.  Apply cold compresses intermittently as needed.  As pain recedes, begin normal activities slowly as tolerated.  - meloxicam (MOBIC) 7.5 MG tablet; Take 1 tablet (7.5 mg total) by mouth daily.  Dispense: 30 tablet; Refill: 1  3. Essential hypertension Stable. Continue bp medication as prescribed  4. Flu vaccine need Flu vaccine administered today.  - Flu Vaccine MDCK QUAD PF  5. Dysuria  General Counseling: Claudetta verbalizes understanding of the findings of todays visit and agrees with plan of treatment. I have discussed any further diagnostic evaluation that may be needed or ordered today. We also reviewed her medications today. she has been encouraged to call the office with any questions or concerns that should arise related  to todays visit.    Counseling:  This patient was seen by Leretha Pol FNP Collaboration with Dr Lavera Guise as a part of collaborative care agreement  Orders Placed This Encounter  Procedures  . Flu Vaccine MDCK QUAD PF    Meds ordered this encounter  Medications  . meloxicam (MOBIC) 7.5 MG tablet    Sig: Take 1 tablet (7.5 mg total) by mouth daily.    Dispense:  30 tablet    Refill:  1    Order Specific Question:   Supervising Provider    Answer:   Lavera Guise [id number]    Time spent: [place], MD  Internal Medicine

## 2018-10-06 IMAGING — CT CT HEAD W/O CM
3 series · 16 of 45 positions shown, 19 images · non-contrast
Comparison: None.

CLINICAL DATA: Headache

EXAM:
CT HEAD WITHOUT CONTRAST
TECHNIQUE: Contiguous axial images were obtained from the base of the skull
through the vertex without intravenous contrast.

[Series 2: head wo · axial · 0.44mm/px · z∈[+237,+352]mm · 10 of 28 slices shown, 13 images]
[im 3/28  brain]
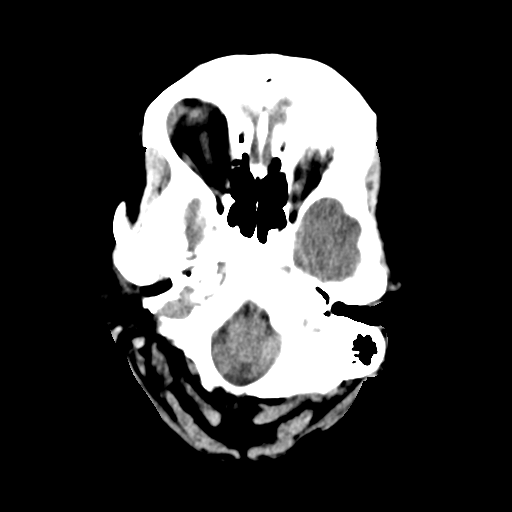
[im 3/28  bone]
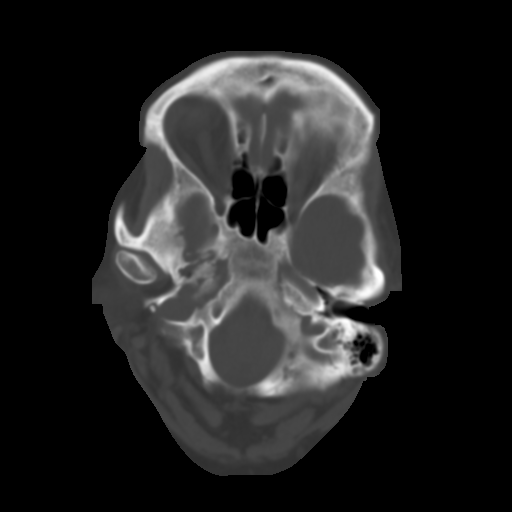
[im 5/28  brain]
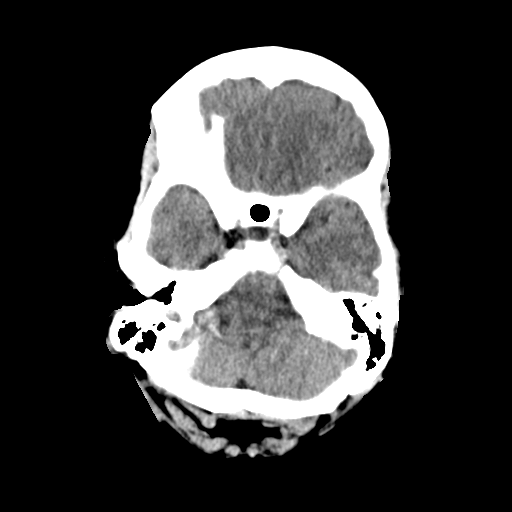
[im 8/28  brain]
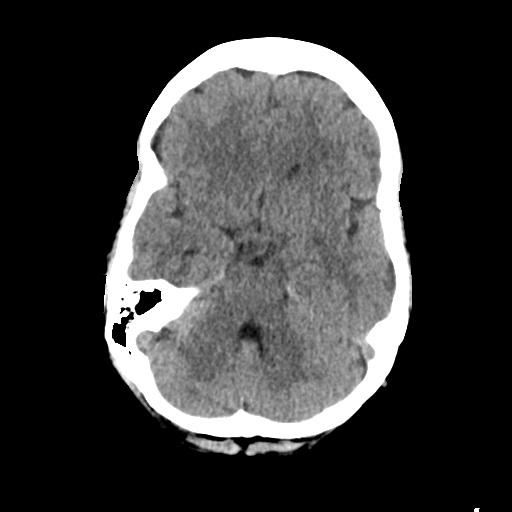
[im 11/28  brain]
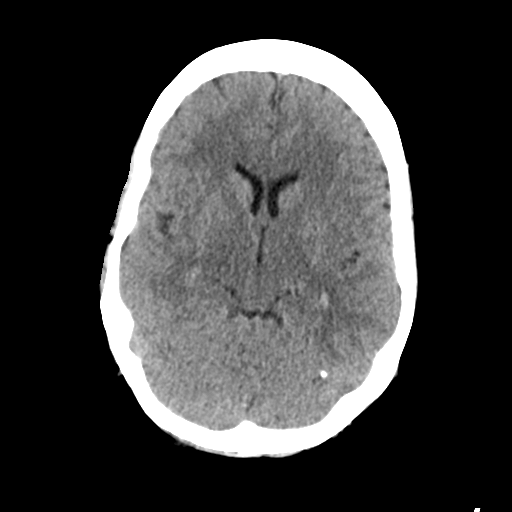
[im 13/28  brain]
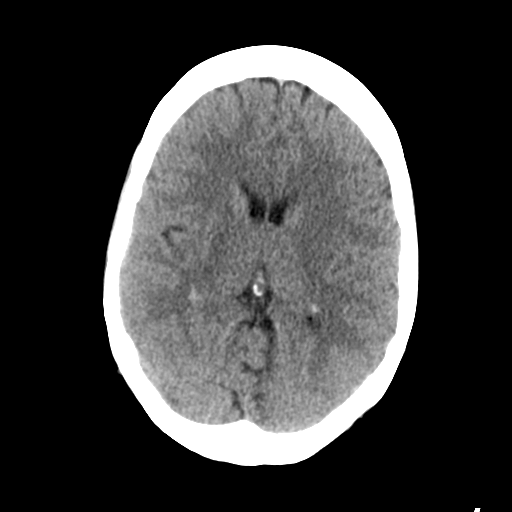
[im 13/28  bone]
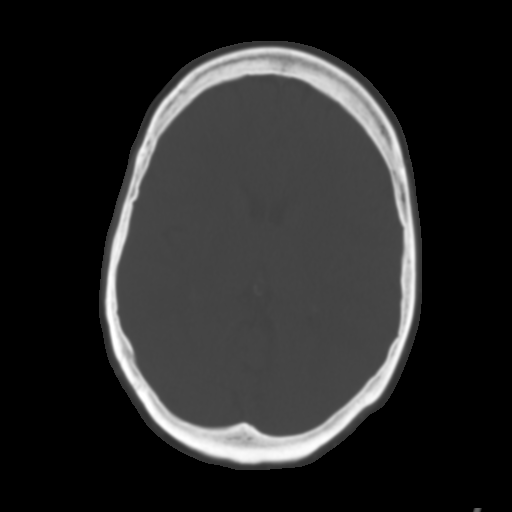
[im 16/28  brain]
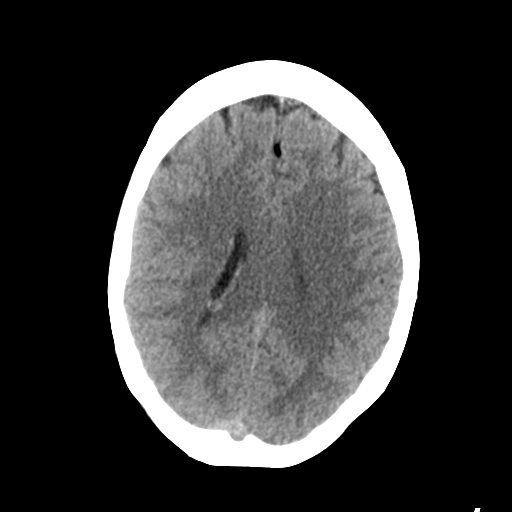
[im 18/28  brain]
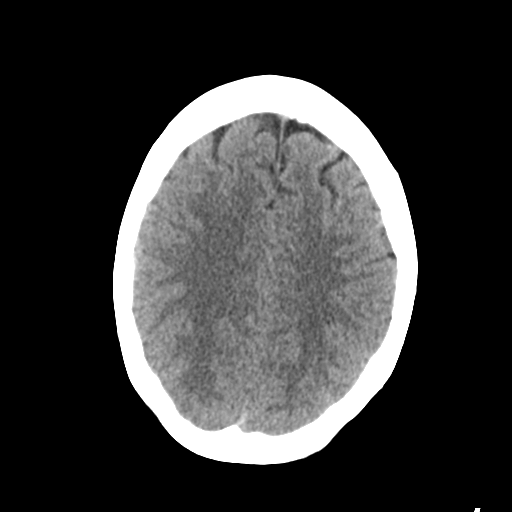
[im 21/28  brain]
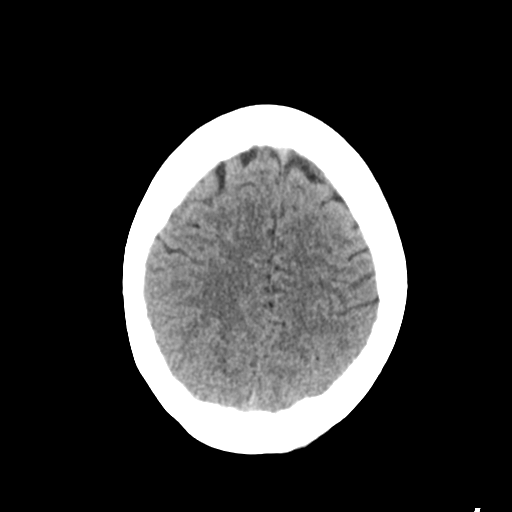
[im 24/28  brain]
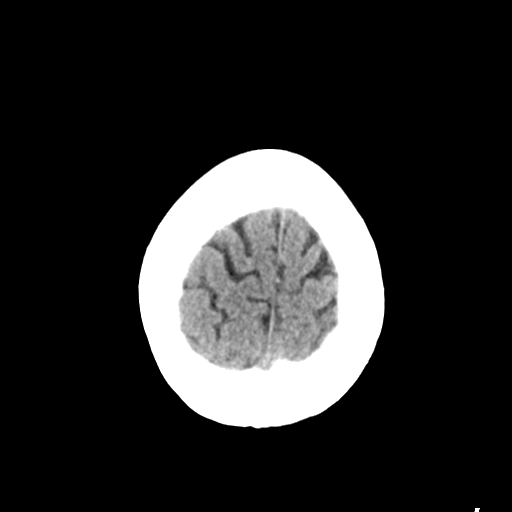
[im 24/28  bone]
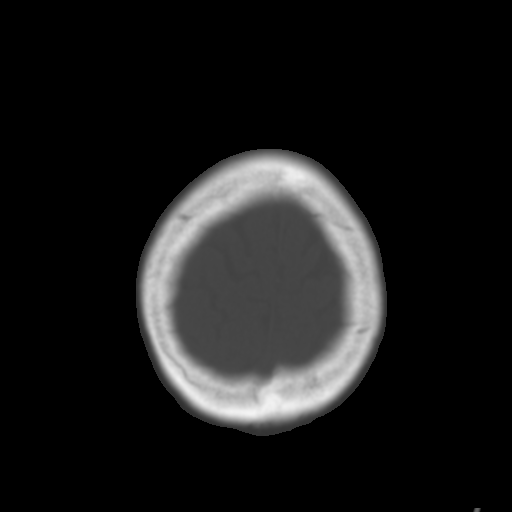
[im 26/28  brain]
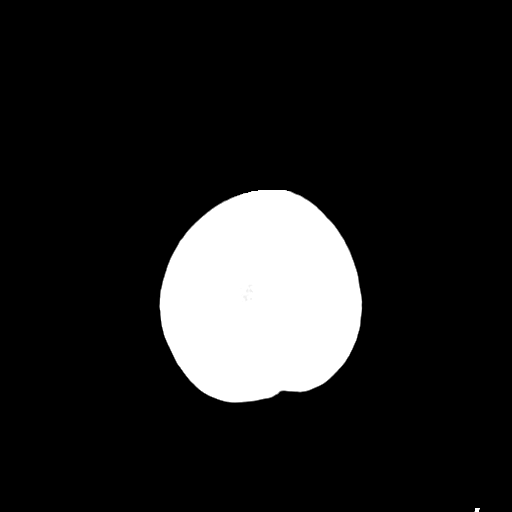

[Series 4: coronal soft tissue · coronal · 0.29mm/px · 3 of 65 slices shown]
[im 22/65  brain]
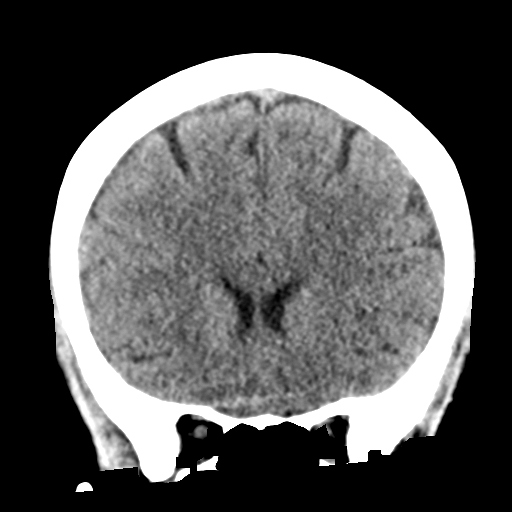
[im 29/65  brain]
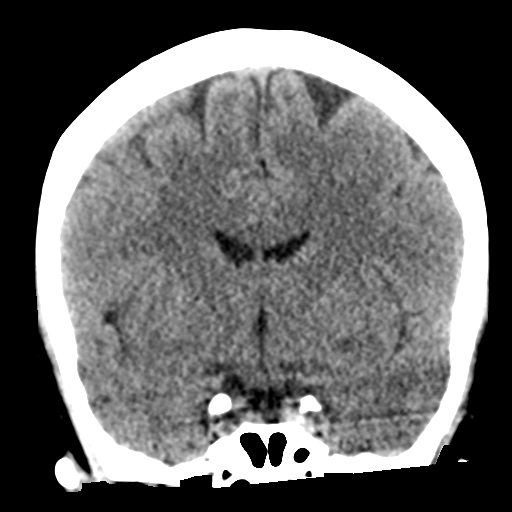
[im 36/65  brain]
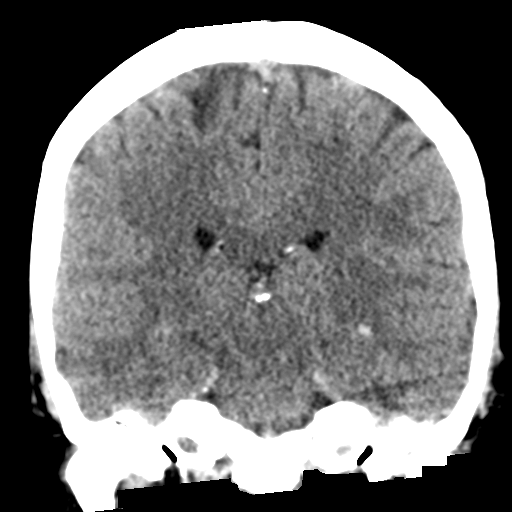

[Series 5: sagittal soft tissue · sagittal · 0.29mm/px · 3 of 51 slices shown]
[im 17/51  brain]
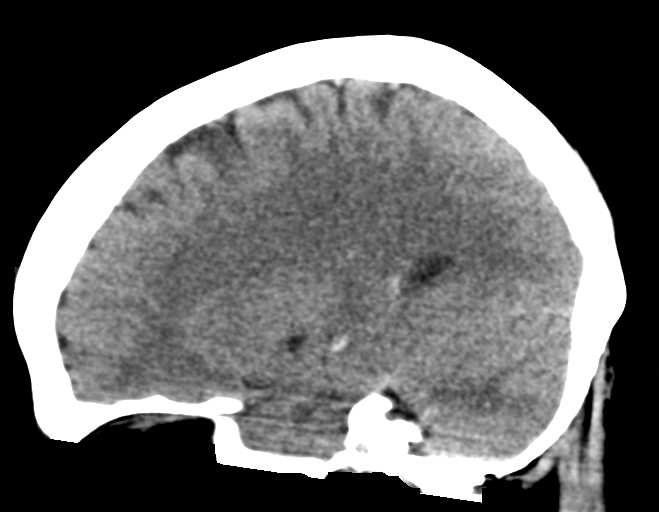
[im 26/51  brain]
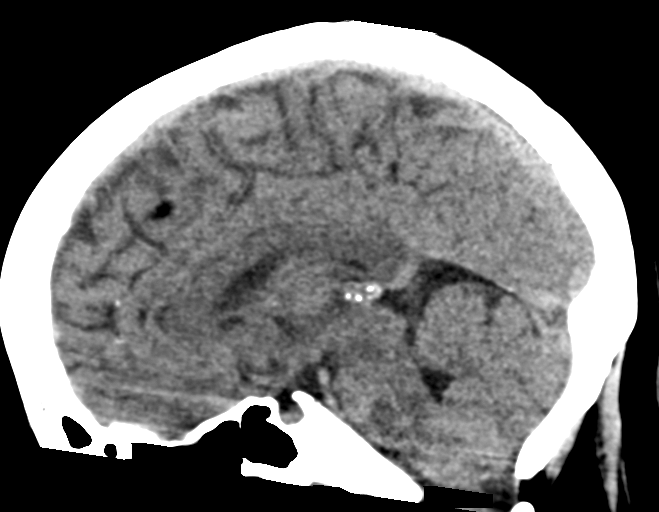
[im 34/51  brain]
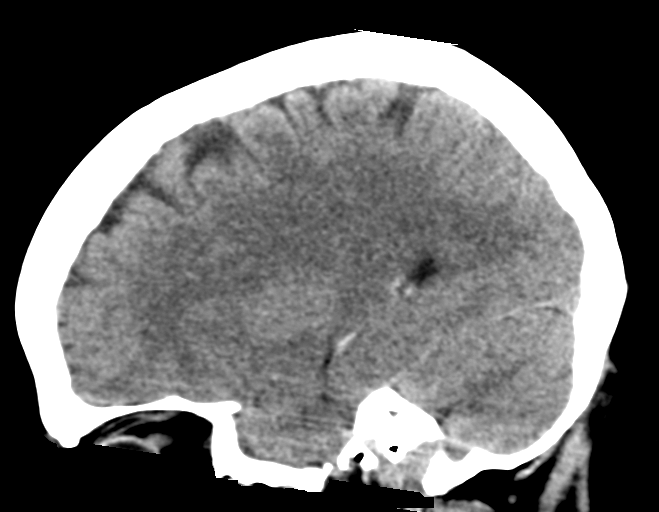

[16 of 45 positions shown; findings below may reference images not displayed]

FINDINGS: Brain: No evidence of acute infarction, hemorrhage, hydrocephalus,
extra-axial collection or mass lesion/mass effect.

Vascular: No hyperdense vessel or unexpected calcification.

Skull: Normal. Negative for fracture or focal lesion.

Sinuses/Orbits: The visualized paranasal sinuses are essentially
clear. The mastoid air cells are unopacified.

Other: None.
IMPRESSION: Normal head CT.

## 2018-10-13 DIAGNOSIS — M25561 Pain in right knee: Secondary | ICD-10-CM | POA: Insufficient documentation

## 2018-10-13 DIAGNOSIS — R300 Dysuria: Secondary | ICD-10-CM | POA: Insufficient documentation

## 2018-10-13 DIAGNOSIS — Z0001 Encounter for general adult medical examination with abnormal findings: Secondary | ICD-10-CM | POA: Insufficient documentation

## 2018-12-05 ENCOUNTER — Other Ambulatory Visit: Payer: Self-pay

## 2018-12-05 DIAGNOSIS — M25561 Pain in right knee: Secondary | ICD-10-CM

## 2018-12-05 MED ORDER — MELOXICAM 7.5 MG PO TABS: 7.5000 mg | ORAL_TABLET | Freq: Every day | ORAL | 1 refills | Status: DC

## 2019-01-02 ENCOUNTER — Telehealth: Payer: Self-pay

## 2019-01-02 NOTE — Telephone Encounter (Signed)
LMOM FOR PATIENT TO CONFIRM AND SCREEN FOR 01-07-19 OV.

## 2019-01-07 ENCOUNTER — Ambulatory Visit: Payer: 59 | Admitting: Nurse Practitioner

## 2019-01-07 ENCOUNTER — Other Ambulatory Visit: Payer: Self-pay

## 2019-01-07 ENCOUNTER — Encounter: Payer: Self-pay | Admitting: Nurse Practitioner

## 2019-01-07 VITALS — Ht 64.0 in | Wt 252.0 lb

## 2019-01-07 DIAGNOSIS — I10 Essential (primary) hypertension: Secondary | ICD-10-CM

## 2019-01-07 DIAGNOSIS — E782 Mixed hyperlipidemia: Secondary | ICD-10-CM | POA: Diagnosis not present

## 2019-01-07 DIAGNOSIS — F5101 Primary insomnia: Secondary | ICD-10-CM

## 2019-01-07 MED ORDER — FENOFIBRIC ACID 135 MG PO CPDR: 1.0000 | DELAYED_RELEASE_CAPSULE | Freq: Every evening | ORAL | 1 refills | Status: DC

## 2019-01-07 MED ORDER — LISINOPRIL 20 MG PO TABS: 20.0000 mg | ORAL_TABLET | Freq: Every day | ORAL | 1 refills | Status: DC

## 2019-01-07 MED ORDER — ZOLPIDEM TARTRATE 10 MG PO TABS: 10.0000 mg | ORAL_TABLET | Freq: Every day | ORAL | 3 refills | Status: DC

## 2019-01-07 MED ORDER — BYSTOLIC 20 MG PO TABS: ORAL_TABLET | ORAL | 1 refills | Status: DC

## 2019-01-07 NOTE — Progress Notes (Signed)
[facility] [place], [place] [address]  Internal MEDICINE  Telephone Visit  Patient Name: Brooke Schultz  [id number]  ID:[id number]  Date of Service: 01/07/2019  I connected with the patient at 5:11pm by webcam and verified the patients identity using two identifiers.   I discussed the limitations, risks, security and privacy concerns of performing an evaluation and management service by webcam and the availability of in person appointments. I also discussed with the patient that there may be a patient responsible charge related to the service.  The patient expressed understanding and agrees to proceed.    Chief Complaint  Patient presents with  . Telephone Assessment  . Telephone Screen  . Hyperlipidemia  . Hypertension    The patient has been contacted via webcam for follow up visit due to concerns for spread of novel coronavirus. The patient presents for follow up visit. The patient has no new concerns or complaints. Was exposed to [place] over the holidays, but never developed any symptoms of the virus. She does need refills for routine medications.       Current Medication: Outpatient Encounter Medications as of 01/07/2019  Medication Sig  . Acetaminophen (TYLENOL EXTRA STRENGTH PO) Take 2 tablets by mouth as needed.  . ALPRAZolam (XANAX) 0.5 MG tablet Take 1 tablet po BID prn  . Choline Fenofibrate (FENOFIBRIC ACID) 135 MG CPDR Take 1 capsule by mouth every evening.  Marland Kitchen lisinopril (ZESTRIL) 20 MG tablet Take 1 tablet (20 mg total) by mouth daily.  . meclizine (ANTIVERT) 12.5 MG tablet Take 12.5 mg by mouth 3 (three) times daily.  . meloxicam (MOBIC) 7.5 MG tablet Take 1 tablet (7.5 mg total) by mouth daily.  . Multiple Vitamin (MULTIVITAMIN) tablet Take 1 tablet by mouth daily.  . Nebivolol HCl (BYSTOLIC) 20 MG TABS Take 1 tab po daily  . phentermine (ADIPEX-P) 37.5 MG tablet Take 1 tablet (37.5 mg total) by mouth daily before breakfast.  . zolpidem  (AMBIEN) 10 MG tablet Take 1 tablet (10 mg total) by mouth at bedtime.  . [DISCONTINUED] Choline Fenofibrate (FENOFIBRIC ACID) 135 MG CPDR Take 1 capsule by mouth every evening.  . [DISCONTINUED] lisinopril (ZESTRIL) 20 MG tablet Take 1 tablet (20 mg total) by mouth daily.  . [DISCONTINUED] Nebivolol HCl (BYSTOLIC) 20 MG TABS Take 1 tab po daily  . [DISCONTINUED] zolpidem (AMBIEN) 10 MG tablet Take 1 tablet (10 mg total) by mouth at bedtime.   No facility-administered encounter medications on file as of 01/07/2019.    Surgical History: Past Surgical History:  Procedure Laterality Date  . ABDOMINAL HYSTERECTOMY    . COLONOSCOPY    . EYE SURGERY    . GALLBLADDER SURGERY    . OVARIAN CYST REMOVAL    . TUBAL LIGATION      Medical History: Past Medical History:  Diagnosis Date  . Hyperlipidemia   . Hypertension   . Insomnia     Family History: Family History  Problem Relation Age of Onset  . Hypertension Mother     Social History   Socioeconomic History  . Marital status: Married    Spouse name: Not on file  . Number of children: Not on file  . Years of education: Not on file  . Highest education level: Not on file  Occupational History  . Not on file  Tobacco Use  . Smoking status: Former Smoker    Types: Cigarettes  . Smokeless tobacco: Never Used  . Tobacco comment: been smoke  free for 7weeks  Substance and Sexual Activity  . Alcohol use: Yes    Comment: ocassionally  . Drug use: No  . Sexual activity: Not on file  Other Topics Concern  . Not on file  Social History Narrative  . Not on file   Social Determinants of Health   Financial Resource Strain:   . Difficulty of Paying Living Expenses: Not on file  Food Insecurity:   . Worried About Charity fundraiser in the Last Year: Not on file  . Ran Out of Food in the Last Year: Not on file  Transportation Needs:   . Lack of Transportation (Medical): Not on file  . Lack of Transportation (Non-Medical): Not  on file  Physical Activity:   . Days of Exercise per Week: Not on file  . Minutes of Exercise per Session: Not on file  Stress:   . Feeling of Stress : Not on file  Social Connections:   . Frequency of Communication with Friends and Family: Not on file  . Frequency of Social Gatherings with Friends and Family: Not on file  . Attends Religious Services: Not on file  . Active Member of Clubs or Organizations: Not on file  . Attends Archivist Meetings: Not on file  . Marital Status: Not on file  Intimate Partner Violence:   . Fear of Current or Ex-Partner: Not on file  . Emotionally Abused: Not on file  . Physically Abused: Not on file  . Sexually Abused: Not on file      Review of Systems  Constitutional: Negative for chills, fatigue and unexpected weight change.  HENT: Negative for congestion, postnasal drip, rhinorrhea, sneezing and sore throat.   Respiratory: Negative for cough, chest tightness, shortness of breath and wheezing.   Cardiovascular: Negative for chest pain and palpitations.  Gastrointestinal: Negative for abdominal pain, constipation, diarrhea, nausea and vomiting.  Endocrine: Negative for cold intolerance, heat intolerance, polydipsia and polyuria.  Musculoskeletal: Negative for arthralgias, back pain, joint swelling and neck pain.  Skin: Negative for rash.  Allergic/Immunologic: Negative for environmental allergies.  Neurological: Negative for dizziness, tremors, numbness and headaches.  Hematological: Negative for adenopathy. Does not bruise/bleed easily.  Psychiatric/Behavioral: Positive for sleep disturbance. Negative for behavioral problems (Depression) and suicidal ideas. The patient is nervous/anxious.     Today's Vitals   01/07/19 1624  Weight: 252 lb (114.3 kg)  Height: 5\' 4"  (1.626 m)   Body mass index is 43.26 kg/m.  Observation/Objective:    The patient is alert and oriented. She is pleasant and answers all questions  appropriately. Breathing is non-labored. She is in no acute distress at this time.   Assessment/Plan: 1. Essential hypertension Blood pressure stable. Continue meds as prescribed  - lisinopril (ZESTRIL) 20 MG tablet; Take 1 tablet (20 mg total) by mouth daily.  Dispense: 90 tablet; Refill: 1 - Nebivolol HCl (BYSTOLIC) 20 MG TABS; Take 1 tab po daily  Dispense: 90 tablet; Refill: 1  2. Mixed hyperlipidemia Continue fenofibrate as prescribed  - Choline Fenofibrate (FENOFIBRIC ACID) 135 MG CPDR; Take 1 capsule by mouth every evening.  Dispense: 90 capsule; Refill: 1  3. Primary insomnia May take ambien 10mg  at bedtime as needed for acute insomnia. Refills provided today.  - zolpidem (AMBIEN) 10 MG tablet; Take 1 tablet (10 mg total) by mouth at bedtime.  Dispense: 30 tablet; Refill: 3  General Counseling: Tiasha verbalizes understanding of the findings of today's phone visit and agrees with plan of treatment.  I have discussed any further diagnostic evaluation that may be needed or ordered today. We also reviewed her medications today. she has been encouraged to call the office with any questions or concerns that should arise related to todays visit.   Hypertension Counseling:  The following hypertensive lifestyle modification were recommended and discussed:  1. Limiting alcohol intake to less than 1 oz/day of ethanol:(24 oz of beer or 8 oz of wine or 2 oz of 100-proof whiskey). 2. Take baby ASA 81 mg daily. 3. Importance of regular aerobic exercise and losing weight. 4. Reduce dietary saturated fat and cholesterol intake for overall cardiovascular health. 5. Maintaining adequate dietary potassium, calcium, and magnesium intake. 6. Regular monitoring of the blood pressure. 7. Reduce sodium intake to less than 100 mmol/day (less than 2.3 gm of sodium or less than 6 gm of sodium choride)   This patient was seen by Yoder with Dr Lavera Guise as a part of collaborative  care agreement  Meds ordered this encounter  Medications  . zolpidem (AMBIEN) 10 MG tablet    Sig: Take 1 tablet (10 mg total) by mouth at bedtime.    Dispense:  30 tablet    Refill:  3    Patient would like to fill as 30 day prescription please.    Order Specific Question:   Supervising Provider    Answer:   Lavera Guise [id number]  . lisinopril (ZESTRIL) 20 MG tablet    Sig: Take 1 tablet (20 mg total) by mouth daily.    Dispense:  90 tablet    Refill:  1    Order Specific Question:   Supervising Provider    Answer:   Lavera Guise [id number]  . Nebivolol HCl (BYSTOLIC) 20 MG TABS    Sig: Take 1 tab po daily    Dispense:  90 tablet    Refill:  1    Order Specific Question:   Supervising Provider    Answer:   Lavera Guise [id number]  . Choline Fenofibrate (FENOFIBRIC ACID) 135 MG CPDR    Sig: Take 1 capsule by mouth every evening.    Dispense:  90 capsule    Refill:  1    Order Specific Question:   Supervising Provider    Answer:   Lavera Guise [id number]    Time spent: 65 Minutes    Dr Lavera Guise Internal medicine

## 2019-02-01 ENCOUNTER — Other Ambulatory Visit: Payer: Self-pay

## 2019-02-01 DIAGNOSIS — M25561 Pain in right knee: Secondary | ICD-10-CM

## 2019-02-01 MED ORDER — MELOXICAM 7.5 MG PO TABS: 7.5000 mg | ORAL_TABLET | Freq: Every day | ORAL | 1 refills | Status: DC

## 2019-04-04 ENCOUNTER — Other Ambulatory Visit: Payer: Self-pay

## 2019-04-04 DIAGNOSIS — M25561 Pain in right knee: Secondary | ICD-10-CM

## 2019-04-04 MED ORDER — MELOXICAM 7.5 MG PO TABS: 7.5000 mg | ORAL_TABLET | Freq: Every day | ORAL | 3 refills | Status: DC

## 2019-04-08 ENCOUNTER — Other Ambulatory Visit: Payer: Self-pay

## 2019-05-01 ENCOUNTER — Other Ambulatory Visit: Payer: Self-pay | Admitting: Nurse Practitioner

## 2019-05-01 ENCOUNTER — Other Ambulatory Visit: Payer: Self-pay

## 2019-05-01 DIAGNOSIS — F5101 Primary insomnia: Secondary | ICD-10-CM

## 2019-05-01 MED ORDER — ZOLPIDEM TARTRATE 10 MG PO TABS: 10.0000 mg | ORAL_TABLET | Freq: Every evening | ORAL | 0 refills | Status: DC | PRN

## 2019-05-01 NOTE — Progress Notes (Signed)
Renewed prescription for ambien 1mg  at bedtime as needed. Sent 30 day rx to [facility].

## 2019-05-07 ENCOUNTER — Telehealth: Payer: Self-pay

## 2019-05-07 NOTE — Telephone Encounter (Signed)
Called lmom informing patient of appointment on 05/09/2019. klh 

## 2019-05-09 ENCOUNTER — Ambulatory Visit: Payer: 59 | Admitting: Adult Health

## 2019-05-09 ENCOUNTER — Encounter: Payer: Self-pay | Admitting: Adult Health

## 2019-05-09 VITALS — Temp 97.6°F | Resp 16 | Ht 64.0 in | Wt 250.0 lb

## 2019-05-09 DIAGNOSIS — E782 Mixed hyperlipidemia: Secondary | ICD-10-CM | POA: Diagnosis not present

## 2019-05-09 DIAGNOSIS — F5101 Primary insomnia: Secondary | ICD-10-CM | POA: Diagnosis not present

## 2019-05-09 DIAGNOSIS — I10 Essential (primary) hypertension: Secondary | ICD-10-CM

## 2019-05-09 DIAGNOSIS — Z1231 Encounter for screening mammogram for malignant neoplasm of breast: Secondary | ICD-10-CM

## 2019-05-09 MED ORDER — ZOLPIDEM TARTRATE 10 MG PO TABS: 10.0000 mg | ORAL_TABLET | Freq: Every evening | ORAL | 2 refills | Status: DC | PRN

## 2019-05-09 NOTE — Progress Notes (Signed)
[facility] [place], [place] [address]  Internal MEDICINE  Telephone Visit  Patient Name: Brooke Schultz  [id number]  [id number]  Date of Service: 05/09/2019  I connected with the patient at 513 by video and verified the patients identity using two identifiers.   I discussed the limitations, risks, security and privacy concerns of performing an evaluation and management service by telephone and the availability of in person appointments. I also discussed with the patient that there may be a patient responsible charge related to the service.  The patient expressed understanding and agrees to proceed.    Chief Complaint  Patient presents with  . Telephone Screen  . Telephone Assessment  . Follow-up  . Hypertension  . Hyperlipidemia    HPI   Pt is seen via video. She is seen to follow up on HTN, Insomnia and HLD.  She reports overall she is doing well. She believes her blood pressure has been controlled. Denies Chest pain, Shortness of breath, palpitations, headache, or blurred vision.  She currently takes [place] for her insomnia and has good results. She is also in need of a mammogram at this time.   Current Medication: Outpatient Encounter Medications as of 05/09/2019  Medication Sig  . Acetaminophen (TYLENOL EXTRA STRENGTH PO) Take 2 tablets by mouth as needed.  . ALPRAZolam (XANAX) 0.5 MG tablet Take 1 tablet po BID prn  . Choline Fenofibrate (FENOFIBRIC ACID) 135 MG CPDR Take 1 capsule by mouth every evening.  Marland Kitchen lisinopril (ZESTRIL) 20 MG tablet Take 1 tablet (20 mg total) by mouth daily.  . meclizine (ANTIVERT) 12.5 MG tablet Take 12.5 mg by mouth 3 (three) times daily.  . meloxicam (MOBIC) 7.5 MG tablet Take 1 tablet (7.5 mg total) by mouth daily.  . Multiple Vitamin (MULTIVITAMIN) tablet Take 1 tablet by mouth daily.  . Nebivolol HCl (BYSTOLIC) 20 MG TABS Take 1 tab po daily  . phentermine (ADIPEX-P) 37.5 MG tablet Take 1 tablet (37.5 mg total) by  mouth daily before breakfast.  . zolpidem (AMBIEN) 10 MG tablet Take 1 tablet (10 mg total) by mouth at bedtime as needed for sleep.   No facility-administered encounter medications on file as of 05/09/2019.    Surgical History: Past Surgical History:  Procedure Laterality Date  . ABDOMINAL HYSTERECTOMY    . COLONOSCOPY    . EYE SURGERY    . GALLBLADDER SURGERY    . OVARIAN CYST REMOVAL    . TUBAL LIGATION      Medical History: Past Medical History:  Diagnosis Date  . Hyperlipidemia   . Hypertension   . Insomnia     Family History: Family History  Problem Relation Age of Onset  . Hypertension Mother     Social History   Socioeconomic History  . Marital status: Married    Spouse name: Not on file  . Number of children: Not on file  . Years of education: Not on file  . Highest education level: Not on file  Occupational History  . Not on file  Tobacco Use  . Smoking status: Former Smoker    Types: Cigarettes  . Smokeless tobacco: Never Used  . Tobacco comment: been smoke free for 7weeks  Substance and Sexual Activity  . Alcohol use: Yes    Comment: ocassionally  . Drug use: No  . Sexual activity: Not on file  Other Topics Concern  . Not on file  Social History Narrative  . Not on file   Social  Determinants of Health   Financial Resource Strain:   . Difficulty of Paying Living Expenses:   Food Insecurity:   . Worried About Charity fundraiser in the Last Year:   . Arboriculturist in the Last Year:   Transportation Needs:   . Film/video editor (Medical):   Marland Kitchen Lack of Transportation (Non-Medical):   Physical Activity:   . Days of Exercise per Week:   . Minutes of Exercise per Session:   Stress:   . Feeling of Stress :   Social Connections:   . Frequency of Communication with Friends and Family:   . Frequency of Social Gatherings with Friends and Family:   . Attends Religious Services:   . Active Member of Clubs or Organizations:   . Attends English as a second language teacher Meetings:   Marland Kitchen Marital Status:   Intimate Partner Violence:   . Fear of Current or Ex-Partner:   . Emotionally Abused:   Marland Kitchen Physically Abused:   . Sexually Abused:       Review of Systems  Constitutional: Negative for chills, fatigue and unexpected weight change.  HENT: Negative for congestion, rhinorrhea, sneezing and sore throat.   Eyes: Negative for photophobia, pain and redness.  Respiratory: Negative for cough, chest tightness and shortness of breath.   Cardiovascular: Negative for chest pain and palpitations.  Gastrointestinal: Negative for abdominal pain, constipation, diarrhea, nausea and vomiting.  Endocrine: Negative.   Genitourinary: Negative for dysuria and frequency.  Musculoskeletal: Negative for arthralgias, back pain, joint swelling and neck pain.  Skin: Negative for rash.  Allergic/Immunologic: Negative.   Neurological: Negative for tremors and numbness.  Hematological: Negative for adenopathy. Does not bruise/bleed easily.  Psychiatric/Behavioral: Negative for behavioral problems and sleep disturbance. The patient is not nervous/anxious.     Vital Signs: Temp 97.6 F (36.4 C)   Resp 16   Ht 5\' 4"  (1.626 m)   Wt 250 lb (113.4 kg)   BMI 42.91 kg/m    Observation/Objective:  Well appearing, NAD noted.    Assessment/Plan: 1. Primary insomnia Continue Ambien as directed.  - zolpidem (AMBIEN) 10 MG tablet; Take 1 tablet (10 mg total) by mouth at bedtime as needed for sleep.  Dispense: 30 tablet; Refill: 2  2. Essential hypertension Stable, continue bystolic and lisinopril as prescribed.   3. Mixed hyperlipidemia Continue with fenofibrate as prescribed.   4. Encounter for screening mammogram for breast cancer - MM DIGITAL SCREENING BILATERAL; Future  General Counseling: lulubelle dillavou understanding of the findings of today's phone visit and agrees with plan of treatment. I have discussed any further diagnostic evaluation that may  be needed or ordered today. We also reviewed her medications today. she has been encouraged to call the office with any questions or concerns that should arise related to todays visit.    No orders of the defined types were placed in this encounter.   No orders of the defined types were placed in this encounter.   Time spent: [facility] Internal medicine

## 2019-05-13 ENCOUNTER — Encounter: Payer: Self-pay | Admitting: Nurse Practitioner

## 2019-05-14 ENCOUNTER — Ambulatory Visit: Payer: 59 | Admitting: Adult Health

## 2019-05-14 ENCOUNTER — Other Ambulatory Visit: Payer: Self-pay

## 2019-05-14 ENCOUNTER — Encounter: Payer: Self-pay | Admitting: Adult Health

## 2019-05-14 VITALS — BP 150/90 | HR 79 | Temp 97.6°F | Resp 16 | Ht 64.0 in | Wt 254.0 lb

## 2019-05-14 DIAGNOSIS — I10 Essential (primary) hypertension: Secondary | ICD-10-CM

## 2019-05-14 DIAGNOSIS — E782 Mixed hyperlipidemia: Secondary | ICD-10-CM

## 2019-05-14 MED ORDER — AMLODIPINE BESYLATE 2.5 MG PO TABS: 2.5000 mg | ORAL_TABLET | Freq: Every day | ORAL | 3 refills | Status: DC

## 2019-05-14 NOTE — Progress Notes (Signed)
[facility] [address] [place], [place] [address]  Internal MEDICINE  Office Visit Note  Patient Name: Brooke Schultz  [id number]  ID:[id number]  Date of Service: 05/14/2019  Chief Complaint  Patient presents with  . Hypertension    150/90     HPI Pt is here for a sick visit.  Pt is here reporting elevated bp over the weekend.  She has been taking her blood pressure, and getting elevated numbers 150's/90's.  She reports she has been having headaches, dizziness and feeling different. Denies any sob or fever.   Current Medication:  Outpatient Encounter Medications as of 05/14/2019  Medication Sig  . Acetaminophen (TYLENOL EXTRA STRENGTH PO) Take 2 tablets by mouth as needed.  . ALPRAZolam (XANAX) 0.5 MG tablet Take 1 tablet po BID prn  . Choline Fenofibrate (FENOFIBRIC ACID) 135 MG CPDR Take 1 capsule by mouth every evening.  Marland Kitchen lisinopril (ZESTRIL) 20 MG tablet Take 1 tablet (20 mg total) by mouth daily.  . meclizine (ANTIVERT) 12.5 MG tablet Take 12.5 mg by mouth 3 (three) times daily.  . meloxicam (MOBIC) 7.5 MG tablet Take 1 tablet (7.5 mg total) by mouth daily.  . Multiple Vitamin (MULTIVITAMIN) tablet Take 1 tablet by mouth daily.  . Nebivolol HCl (BYSTOLIC) 20 MG TABS Take 1 tab po daily  . phentermine (ADIPEX-P) 37.5 MG tablet Take 1 tablet (37.5 mg total) by mouth daily before breakfast.  . zolpidem (AMBIEN) 10 MG tablet Take 1 tablet (10 mg total) by mouth at bedtime as needed for sleep.  Marland Kitchen amLODipine (NORVASC) 2.5 MG tablet Take 1 tablet (2.5 mg total) by mouth daily.   No facility-administered encounter medications on file as of 05/14/2019.      Medical History: Past Medical History:  Diagnosis Date  . Hyperlipidemia   . Hypertension   . Insomnia      Vital Signs: BP (!) 150/90   Pulse 79   Temp 97.6 F (36.4 C)   Resp 16   Ht 5\' 4"  (1.626 m)   Wt 254 lb (115.2 kg)   SpO2 97%   BMI 43.60 kg/m    Review of Systems  Constitutional:  Negative for chills, fatigue and unexpected weight change.  HENT: Negative for congestion, rhinorrhea, sneezing and sore throat.   Eyes: Negative for photophobia, pain and redness.  Respiratory: Negative for cough, chest tightness and shortness of breath.   Cardiovascular: Negative for chest pain and palpitations.  Gastrointestinal: Negative for abdominal pain, constipation, diarrhea, nausea and vomiting.  Endocrine: Negative.   Genitourinary: Negative for dysuria and frequency.  Musculoskeletal: Negative for arthralgias, back pain, joint swelling and neck pain.  Skin: Negative for rash.  Allergic/Immunologic: Negative.   Neurological: Negative for tremors and numbness.  Hematological: Negative for adenopathy. Does not bruise/bleed easily.  Psychiatric/Behavioral: Negative for behavioral problems and sleep disturbance. The patient is not nervous/anxious.     Physical Exam Vitals and nursing note reviewed.  Constitutional:      General: She is not in acute distress.    Appearance: She is well-developed. She is not diaphoretic.  HENT:     Head: Normocephalic and atraumatic.     Mouth/Throat:     Pharynx: No oropharyngeal exudate.  Eyes:     Pupils: Pupils are equal, round, and reactive to light.  Neck:     Thyroid: No thyromegaly.     Vascular: No JVD.     Trachea: No tracheal deviation.  Cardiovascular:     Rate and  Rhythm: Normal rate and regular rhythm.     Heart sounds: Normal heart sounds. No murmur. No friction rub. No gallop.   Pulmonary:     Effort: Pulmonary effort is normal. No respiratory distress.     Breath sounds: Normal breath sounds. No wheezing or rales.  Chest:     Chest wall: No tenderness.  Abdominal:     Palpations: Abdomen is soft.     Tenderness: There is no abdominal tenderness. There is no guarding.  Musculoskeletal:        General: Normal range of motion.     Cervical back: Normal range of motion and neck supple.  Lymphadenopathy:     Cervical: No  cervical adenopathy.  Skin:    General: Skin is warm and dry.  Neurological:     Mental Status: She is alert and oriented to person, place, and time.     Cranial Nerves: No cranial nerve deficit.  Psychiatric:        Behavior: Behavior normal.        Thought Content: Thought content normal.        Judgment: Judgment normal.    Assessment/Plan: 1. Essential hypertension Add Norvasc, and continue to monitor blood pressure. Follow up in 4 weeks. - amLODipine (NORVASC) 2.5 MG tablet; Take 1 tablet (2.5 mg total) by mouth daily.  Dispense: 90 tablet; Refill: 3  2. Mixed hyperlipidemia Continue current management.  General Counseling: tomeika enriquez understanding of the findings of todays visit and agrees with plan of treatment. I have discussed any further diagnostic evaluation that may be needed or ordered today. We also reviewed her medications today. she has been encouraged to call the office with any questions or concerns that should arise related to todays visit.   No orders of the defined types were placed in this encounter.   Meds ordered this encounter  Medications  . amLODipine (NORVASC) 2.5 MG tablet    Sig: Take 1 tablet (2.5 mg total) by mouth daily.    Dispense:  90 tablet    Refill:  3    Time spent: 25 Minutes  This patient was seen by Orson Gear AGNP-C in Collaboration with Dr Lavera Guise as a part of collaborative care agreement.  Kendell Bane AGNP-C Internal Medicine

## 2019-05-14 NOTE — Telephone Encounter (Signed)
Please look into this

## 2019-05-31 ENCOUNTER — Encounter: Payer: Self-pay | Admitting: Nurse Practitioner

## 2019-05-31 NOTE — Telephone Encounter (Signed)
Hey. Can you change her appointment> thanks

## 2019-06-04 ENCOUNTER — Ambulatory Visit: Payer: 59 | Admitting: Nurse Practitioner

## 2019-06-14 ENCOUNTER — Telehealth: Payer: Self-pay

## 2019-06-14 NOTE — Telephone Encounter (Signed)
Lmom to confirm and screen for 06-18-19 ov. 

## 2019-06-18 ENCOUNTER — Ambulatory Visit: Payer: 59 | Admitting: Nurse Practitioner

## 2019-06-27 ENCOUNTER — Telehealth: Payer: Self-pay

## 2019-06-27 NOTE — Telephone Encounter (Signed)
LEFT MESSAGE REGARDING MISSED APPT 06/18/19

## 2019-07-09 ENCOUNTER — Other Ambulatory Visit: Payer: Self-pay

## 2019-07-09 DIAGNOSIS — I10 Essential (primary) hypertension: Secondary | ICD-10-CM

## 2019-07-09 DIAGNOSIS — E782 Mixed hyperlipidemia: Secondary | ICD-10-CM

## 2019-07-09 MED ORDER — FENOFIBRIC ACID 135 MG PO CPDR: 1.0000 | DELAYED_RELEASE_CAPSULE | Freq: Every evening | ORAL | 0 refills | Status: DC

## 2019-07-09 MED ORDER — LISINOPRIL 20 MG PO TABS: 20.0000 mg | ORAL_TABLET | Freq: Every day | ORAL | 0 refills | Status: DC

## 2019-07-19 ENCOUNTER — Other Ambulatory Visit: Payer: Self-pay

## 2019-07-19 DIAGNOSIS — I10 Essential (primary) hypertension: Secondary | ICD-10-CM

## 2019-07-19 MED ORDER — BYSTOLIC 20 MG PO TABS: ORAL_TABLET | ORAL | 1 refills | Status: DC

## 2019-08-22 ENCOUNTER — Encounter: Payer: Self-pay | Admitting: Nurse Practitioner

## 2019-08-22 ENCOUNTER — Ambulatory Visit (INDEPENDENT_AMBULATORY_CARE_PROVIDER_SITE_OTHER): Payer: 59 | Admitting: Nurse Practitioner

## 2019-08-22 VITALS — Temp 98.2°F | Ht 64.0 in | Wt 254.0 lb

## 2019-08-22 DIAGNOSIS — R059 Cough, unspecified: Secondary | ICD-10-CM

## 2019-08-22 DIAGNOSIS — J069 Acute upper respiratory infection, unspecified: Secondary | ICD-10-CM

## 2019-08-22 DIAGNOSIS — F5101 Primary insomnia: Secondary | ICD-10-CM

## 2019-08-22 DIAGNOSIS — R05 Cough: Secondary | ICD-10-CM

## 2019-08-22 MED ORDER — ZOLPIDEM TARTRATE 10 MG PO TABS: 10.0000 mg | ORAL_TABLET | Freq: Every evening | ORAL | 2 refills | Status: DC | PRN

## 2019-08-22 MED ORDER — AZITHROMYCIN 250 MG PO TABS: ORAL_TABLET | ORAL | 0 refills | Status: DC

## 2019-08-22 MED ORDER — HYDROCOD POLST-CPM POLST ER 10-8 MG/5ML PO SUER: 5.0000 mL | Freq: Two times a day (BID) | ORAL | 0 refills | Status: DC | PRN

## 2019-08-22 NOTE — Progress Notes (Signed)
[facility] [place], [place] [address]  Internal MEDICINE  Telephone Visit  Patient Name: Brooke Schultz  [id number]  [id number]  Date of Service: 09/08/2019  I connected with the patient at :08pm by telephone and verified the patients identity using two identifiers.   I discussed the limitations, risks, security and privacy concerns of performing an evaluation and management service by telephone and the availability of in person appointments. I also discussed with the patient that there may be a patient responsible charge related to the service.  The patient expressed understanding and agrees to proceed.    Chief Complaint  Patient presents with  . Telephone Assessment  . Telephone Screen    [phone] video  . Cough    going on 2 weeks  . Sore Throat  . Sinusitis  . Hypertension    The patient has been contacted via telephone for follow up visit due to concerns for spread of novel coronavirus. The patient has sore throat, dry, deep cough. She is able to cough up thick greenish phlegm. She did have virtual visit with online provider over the weekend. She was started on augmentin. She states that this made her very sick to her stomach. She states that she stuck it out for four days. Did acutally see improvement in the upper respiratory symptoms, but after she stopped the augmentin, symptoms quickly returned.       Current Medication: Outpatient Encounter Medications as of 08/22/2019  Medication Sig  . Acetaminophen (TYLENOL EXTRA STRENGTH PO) Take 2 tablets by mouth as needed.  . ALPRAZolam (XANAX) 0.5 MG tablet Take 1 tablet po BID prn  . amLODipine (NORVASC) 2.5 MG tablet Take 1 tablet (2.5 mg total) by mouth daily.  . Choline Fenofibrate (FENOFIBRIC ACID) 135 MG CPDR Take 1 capsule by mouth every evening.  Marland Kitchen lisinopril (ZESTRIL) 20 MG tablet Take 1 tablet (20 mg total) by mouth daily.  . meclizine (ANTIVERT) 12.5 MG tablet Take 12.5 mg by mouth 3  (three) times daily.  . meloxicam (MOBIC) 7.5 MG tablet Take 1 tablet (7.5 mg total) by mouth daily.  . Multiple Vitamin (MULTIVITAMIN) tablet Take 1 tablet by mouth daily.  . Nebivolol HCl (BYSTOLIC) 20 MG TABS Take 1 tab po daily  . zolpidem (AMBIEN) 10 MG tablet Take 1 tablet (10 mg total) by mouth at bedtime as needed for sleep.  . [DISCONTINUED] zolpidem (AMBIEN) 10 MG tablet Take 1 tablet (10 mg total) by mouth at bedtime as needed for sleep.  Marland Kitchen azithromycin (ZITHROMAX) 250 MG tablet z-pack - take as directed for 5 days  . chlorpheniramine-HYDROcodone (TUSSIONEX PENNKINETIC ER) 10-8 MG/5ML SUER Take 5 mLs by mouth every 12 (twelve) hours as needed for cough.  . [DISCONTINUED] phentermine (ADIPEX-P) 37.5 MG tablet Take 1 tablet (37.5 mg total) by mouth daily before breakfast. (Patient not taking: Reported on 08/22/2019)   No facility-administered encounter medications on file as of 08/22/2019.    Surgical History: Past Surgical History:  Procedure Laterality Date  . ABDOMINAL HYSTERECTOMY    . COLONOSCOPY    . EYE SURGERY    . GALLBLADDER SURGERY    . OVARIAN CYST REMOVAL    . TUBAL LIGATION      Medical History: Past Medical History:  Diagnosis Date  . Hyperlipidemia   . Hypertension   . Insomnia     Family History: Family History  Problem Relation Age of Onset  . Hypertension Mother     Social History  Socioeconomic History  . Marital status: Married    Spouse name: Not on file  . Number of children: Not on file  . Years of education: Not on file  . Highest education level: Not on file  Occupational History  . Not on file  Tobacco Use  . Smoking status: Former Smoker    Types: Cigarettes  . Smokeless tobacco: Never Used  . Tobacco comment: been smoke free for 7weeks  Vaping Use  . Vaping Use: Every day  . Devices: JUUL  Substance and Sexual Activity  . Alcohol use: Yes    Comment: ocassionally  . Drug use: No  . Sexual activity: Not on file  Other  Topics Concern  . Not on file  Social History Narrative  . Not on file   Social Determinants of Health   Financial Resource Strain:   . Difficulty of Paying Living Expenses: Not on file  Food Insecurity:   . Worried About Charity fundraiser in the Last Year: Not on file  . Ran Out of Food in the Last Year: Not on file  Transportation Needs:   . Lack of Transportation (Medical): Not on file  . Lack of Transportation (Non-Medical): Not on file  Physical Activity:   . Days of Exercise per Week: Not on file  . Minutes of Exercise per Session: Not on file  Stress:   . Feeling of Stress : Not on file  Social Connections:   . Frequency of Communication with Friends and Family: Not on file  . Frequency of Social Gatherings with Friends and Family: Not on file  . Attends Religious Services: Not on file  . Active Member of Clubs or Organizations: Not on file  . Attends Archivist Meetings: Not on file  . Marital Status: Not on file  Intimate Partner Violence:   . Fear of Current or Ex-Partner: Not on file  . Emotionally Abused: Not on file  . Physically Abused: Not on file  . Sexually Abused: Not on file      Review of Systems  Constitutional: Positive for chills and fatigue. Negative for fever.  HENT: Positive for congestion, postnasal drip, rhinorrhea, sinus pain and sore throat. Negative for ear pain and voice change.   Respiratory: Positive for cough. Negative for wheezing.   Cardiovascular: Negative for chest pain and palpitations.  Gastrointestinal: Positive for nausea.  Musculoskeletal: Positive for myalgias.  Allergic/Immunologic: Positive for environmental allergies.  Neurological: Positive for headaches.  Hematological: Positive for adenopathy.    Today's Vitals   08/22/19 1617  Temp: 98.2 F (36.8 C)  Weight: 254 lb (115.2 kg)  Height: 5\' 4"  (1.626 m)   Body mass index is 43.6 kg/m.  Observation/Objective:   The patient is alert and oriented.  She is pleasant and answers all questions appropriately. Breathing is non-labored. She is in no acute distress at this time.  The patient is nasally congested and has a dry, harsh cough.    Assessment/Plan: 1. Acute upper respiratory infection Start z-pack. Take as directed for 5 days. Rest and increase fluids. May take OTC medications as needed and as indicated to improve acute symptoms.  - azithromycin (ZITHROMAX) 250 MG tablet; z-pack - take as directed for 5 days  Dispense: 6 tablet; Refill: 0  2. Cough May take tussionex twice daily as needed for cough. Advised patient not to overuse this medicine and not to mix with other medications or alcohol as it can cause respiratory distress, sleepiness or dizziness.  Should also avoid driving. Patient voiced understanding and agreement.  - chlorpheniramine-HYDROcodone (TUSSIONEX PENNKINETIC ER) 10-8 MG/5ML SUER; Take 5 mLs by mouth every 12 (twelve) hours as needed for cough.  Dispense: 115 mL; Refill: 0  3. Primary insomnia May take zolpidem 10mg  at bedtime as needed for insomnia.  - zolpidem (AMBIEN) 10 MG tablet; Take 1 tablet (10 mg total) by mouth at bedtime as needed for sleep.  Dispense: 30 tablet; Refill: 2  General Counseling: Siboney verbalizes understanding of the findings of today's phone visit and agrees with plan of treatment. I have discussed any further diagnostic evaluation that may be needed or ordered today. We also reviewed her medications today. she has been encouraged to call the office with any questions or concerns that should arise related to todays visit.   This patient was seen by [place] with Dr Lavera Guise as a part of collaborative care agreement  Meds ordered this encounter  Medications  . chlorpheniramine-HYDROcodone (TUSSIONEX PENNKINETIC ER) 10-8 MG/5ML SUER    Sig: Take 5 mLs by mouth every 12 (twelve) hours as needed for cough.    Dispense:  115 mL    Refill:  0    Order Specific  Question:   Supervising Provider    Answer:   Lavera Guise [[id number]]  . azithromycin (ZITHROMAX) 250 MG tablet    Sig: z-pack - take as directed for 5 days    Dispense:  6 tablet    Refill:  0    Order Specific Question:   Supervising Provider    Answer:   Lavera Guise [place]  . zolpidem (AMBIEN) 10 MG tablet    Sig: Take 1 tablet (10 mg total) by mouth at bedtime as needed for sleep.    Dispense:  30 tablet    Refill:  2    Patient would like to fill as 30 day prescription please.    Order Specific Question:   Supervising Provider    Answer:   Lavera Guise [[id number]]    Time spent: 28 Minutes    Dr Lavera Guise Internal medicine

## 2019-09-08 DIAGNOSIS — R059 Cough, unspecified: Secondary | ICD-10-CM | POA: Insufficient documentation

## 2019-09-08 DIAGNOSIS — J069 Acute upper respiratory infection, unspecified: Secondary | ICD-10-CM | POA: Insufficient documentation

## 2019-10-03 ENCOUNTER — Ambulatory Visit: Payer: 59 | Admitting: Nurse Practitioner

## 2019-10-04 ENCOUNTER — Other Ambulatory Visit: Payer: Self-pay

## 2019-10-04 DIAGNOSIS — E782 Mixed hyperlipidemia: Secondary | ICD-10-CM

## 2019-10-04 DIAGNOSIS — I10 Essential (primary) hypertension: Secondary | ICD-10-CM

## 2019-10-04 MED ORDER — FENOFIBRIC ACID 135 MG PO CPDR: 1.0000 | DELAYED_RELEASE_CAPSULE | Freq: Every evening | ORAL | 1 refills | Status: DC

## 2019-10-04 MED ORDER — LISINOPRIL 20 MG PO TABS: 20.0000 mg | ORAL_TABLET | Freq: Every day | ORAL | 1 refills | Status: DC

## 2019-12-05 ENCOUNTER — Encounter: Payer: Self-pay | Admitting: Internal Medicine

## 2019-12-05 ENCOUNTER — Ambulatory Visit (INDEPENDENT_AMBULATORY_CARE_PROVIDER_SITE_OTHER): Payer: 59 | Admitting: Internal Medicine

## 2019-12-05 ENCOUNTER — Other Ambulatory Visit: Payer: Self-pay

## 2019-12-05 VITALS — BP 114/86 | HR 80 | Temp 97.4°F | Resp 16 | Ht 64.0 in | Wt 247.0 lb

## 2019-12-05 DIAGNOSIS — T466X5A Adverse effect of antihyperlipidemic and antiarteriosclerotic drugs, initial encounter: Secondary | ICD-10-CM

## 2019-12-05 DIAGNOSIS — F5101 Primary insomnia: Secondary | ICD-10-CM

## 2019-12-05 DIAGNOSIS — I10 Essential (primary) hypertension: Secondary | ICD-10-CM | POA: Diagnosis not present

## 2019-12-05 DIAGNOSIS — E782 Mixed hyperlipidemia: Secondary | ICD-10-CM | POA: Diagnosis not present

## 2019-12-05 DIAGNOSIS — Z6841 Body Mass Index (BMI) 40.0 and over, adult: Secondary | ICD-10-CM | POA: Diagnosis not present

## 2019-12-05 DIAGNOSIS — R300 Dysuria: Secondary | ICD-10-CM

## 2019-12-05 DIAGNOSIS — Z0001 Encounter for general adult medical examination with abnormal findings: Secondary | ICD-10-CM | POA: Diagnosis not present

## 2019-12-05 MED ORDER — ZOLPIDEM TARTRATE 10 MG PO TABS: 10.0000 mg | ORAL_TABLET | Freq: Every evening | ORAL | 2 refills | Status: DC | PRN

## 2019-12-05 MED ORDER — FENOFIBRATE 145 MG PO TABS: 145.0000 mg | ORAL_TABLET | Freq: Every day | ORAL | 3 refills | Status: DC

## 2019-12-05 NOTE — Progress Notes (Signed)
[facility] [place], [place] [address]  Internal MEDICINE  Office Visit Note  Patient Name: Brooke Schultz  [id number]  [id number]  Date of Service: 12/07/2019  Chief Complaint  Patient presents with  . Medicare Wellness    refill request  . Hypertension  . Hyperlipidemia  . policy update form    received     HPI Pt is here for routine health maintenance examination. Denies any complaints, she is here to get refills. Chart is reviewed for continuity of care. She is upset that she had to pay for her colonoscopy due the fact she had abnormal Cologuard.BP is under good control. Unable to take statins due to anaphylaxis. She is on Tricor, takes Ambien for insomnia, no symptoms of OSA  Denies any chest pain or sob at this time    Preventive Health maintenance   Routine health maintenance examination 11/201  Mammogram 08/2019 [facility]   Colonoscopy 2019, repeat q 3 years according to guidelines    Current Medication: Outpatient Encounter Medications as of 12/05/2019  Medication Sig  . Acetaminophen (TYLENOL EXTRA STRENGTH PO) Take 2 tablets by mouth as needed.  Marland Kitchen amLODipine (NORVASC) 2.5 MG tablet Take 1 tablet (2.5 mg total) by mouth daily.  Marland Kitchen lisinopril (ZESTRIL) 20 MG tablet Take 1 tablet (20 mg total) by mouth daily.  . Multiple Vitamin (MULTIVITAMIN) tablet Take 1 tablet by mouth daily.  . Nebivolol HCl (BYSTOLIC) 20 MG TABS Take 1 tab po daily  . zolpidem (AMBIEN) 10 MG tablet Take 1 tablet (10 mg total) by mouth at bedtime as needed for sleep.  . [DISCONTINUED] azithromycin (ZITHROMAX) 250 MG tablet z-pack - take as directed for 5 days  . [DISCONTINUED] chlorpheniramine-HYDROcodone (TUSSIONEX PENNKINETIC ER) 10-8 MG/5ML SUER Take 5 mLs by mouth every 12 (twelve) hours as needed for cough.  . [DISCONTINUED] zolpidem (AMBIEN) 10 MG tablet Take 1 tablet (10 mg total) by mouth at bedtime as needed for sleep.  . fenofibrate (TRICOR) 145 MG tablet Take 1  tablet (145 mg total) by mouth daily.  . [DISCONTINUED] ALPRAZolam (XANAX) 0.5 MG tablet Take 1 tablet po BID prn (Patient not taking: Reported on 12/05/2019)  . [DISCONTINUED] Choline Fenofibrate (FENOFIBRIC ACID) 135 MG CPDR Take 1 capsule by mouth every evening. (Patient not taking: Reported on 12/05/2019)  . [DISCONTINUED] meclizine (ANTIVERT) 12.5 MG tablet Take 12.5 mg by mouth 3 (three) times daily. (Patient not taking: Reported on 12/05/2019)  . [DISCONTINUED] meloxicam (MOBIC) 7.5 MG tablet Take 1 tablet (7.5 mg total) by mouth daily. (Patient not taking: Reported on 12/05/2019)   No facility-administered encounter medications on file as of 12/05/2019.    Surgical History: Past Surgical History:  Procedure Laterality Date  . ABDOMINAL HYSTERECTOMY    . COLONOSCOPY    . EYE SURGERY    . GALLBLADDER SURGERY    . OVARIAN CYST REMOVAL    . TUBAL LIGATION      Medical History: Past Medical History:  Diagnosis Date  . Hyperlipidemia   . Hypertension   . Insomnia     Family History: Family History  Problem Relation Age of Onset  . Hypertension Mother       Review of Systems  Constitutional: Negative for chills, diaphoresis and fatigue.  HENT: Negative for ear pain, postnasal drip and sinus pressure.   Eyes: Negative for photophobia, discharge, redness, itching and visual disturbance.  Respiratory: Negative for cough, shortness of breath and wheezing.   Cardiovascular: Negative for chest pain, palpitations  and leg swelling.  Gastrointestinal: Negative for abdominal pain, constipation, diarrhea, nausea and vomiting.  Genitourinary: Negative for dysuria and flank pain.  Musculoskeletal: Negative for arthralgias, back pain, gait problem and neck pain.  Skin: Negative for color change.  Allergic/Immunologic: Negative for environmental allergies and food allergies.  Neurological: Negative for dizziness and headaches.  Hematological: Does not bruise/bleed easily.   Psychiatric/Behavioral: Positive for sleep disturbance. Negative for agitation, behavioral problems (depression), hallucinations and suicidal ideas.     Vital Signs: BP 114/86   Pulse 80   Temp (!) 97.4 F (36.3 C)   Resp 16   Ht 5\' 4"  (1.626 m)   Wt 247 lb (112 kg)   SpO2 98%   BMI 42.40 kg/m    Physical Exam Constitutional:      General: She is not in acute distress.    Appearance: She is well-developed. She is not diaphoretic.  HENT:     Head: Normocephalic and atraumatic.     Right Ear: External ear normal.     Left Ear: External ear normal.     Nose: Nose normal.     Mouth/Throat:     Pharynx: No oropharyngeal exudate.  Eyes:     General: No scleral icterus.       Right eye: No discharge.        Left eye: No discharge.     Conjunctiva/sclera: Conjunctivae normal.     Pupils: Pupils are equal, round, and reactive to light.  Neck:     Thyroid: No thyromegaly.     Vascular: No JVD.     Trachea: No tracheal deviation.  Cardiovascular:     Rate and Rhythm: Normal rate and regular rhythm.     Heart sounds: Normal heart sounds. No murmur heard.  No friction rub. No gallop.   Pulmonary:     Effort: Pulmonary effort is normal. No respiratory distress.     Breath sounds: Normal breath sounds. No stridor. No wheezing or rales.  Chest:     Chest wall: No tenderness.     Breasts:        Right: Normal.        Left: Normal.  Abdominal:     General: Bowel sounds are normal. There is no distension.     Palpations: Abdomen is soft. There is no mass.     Tenderness: There is no abdominal tenderness. There is no guarding or rebound.  Musculoskeletal:        General: No tenderness or deformity. Normal range of motion.     Cervical back: Normal range of motion and neck supple.  Lymphadenopathy:     Cervical: No cervical adenopathy.  Skin:    General: Skin is warm and dry.     Coloration: Skin is not pale.     Findings: No erythema or rash.  Neurological:     Mental  Status: She is alert.     Cranial Nerves: No cranial nerve deficit.     Motor: No abnormal muscle tone.     Coordination: Coordination normal.     Deep Tendon Reflexes: Reflexes are normal and symmetric.  Psychiatric:        Behavior: Behavior normal.        Thought Content: Thought content normal.        Judgment: Judgment normal.     Assessment/Plan: 1. Encounter for general adult medical examination with abnormal findings Pt is updated as follows for Preventive Health Maintenance (PHM) AWV  Mammogram  Colonoscopy  BMD    2. BMI 40.0-44.9, adult (HCC) Encouraged wt loss Obesity Counseling: Risk Assessment: An assessment of behavioral risk factors was made today and includes lack of exercise sedentary lifestyle, lack of portion control and poor dietary habits. Risk Modification Advice: She was counseled on portion control guidelines. Restricting daily caloric intake to.1500 . The detrimental long term effects of obesity on her health and ongoing poor compliance was also discussed with the patient.  3. Benign hypertension BP is well controlled   4. Adverse effect of statin Pt had anaphylaxis related to statin use, will continue on tricor, might want to change to Vascepa   5. Mixed hyperlipidemia Continue Tricor  - Lipid Panel With LDL/HDL Ratio   6. Dysuria Unable to given specimen, will check on next visit   7. Primary insomnia If worsening symptoms in future, will get baseline sleep study  - zolpidem (AMBIEN) 10 MG tablet; Take 1 tablet (10 mg total) by mouth at bedtime as needed for sleep.  Dispense: 30 tablet; Refill: 2 General Counseling: Silvia verbalizes understanding of the findings of todays visit and agrees with plan of treatment. I have discussed any further diagnostic evaluation that may be needed or ordered today. We also reviewed her medications today. she has been encouraged to call the office with any questions or concerns that should arise related to  todays visit.  Counseling: Hypertension Counseling:   The following hypertensive lifestyle modification were recommended and discussed:  1. Limiting alcohol intake to less than 1 oz/day of ethanol:(24 oz of beer or 8 oz of wine or 2 oz of 100-proof whiskey). 2. Take baby ASA 81 mg daily. 3. Importance of regular aerobic exercise and losing weight. 4. Reduce dietary saturated fat and cholesterol intake for overall cardiovascular health. 5. Maintaining adequate dietary potassium, calcium, and magnesium intake. 6. Regular monitoring of the blood pressure. 7. Reduce sodium intake to less than 100 mmol/day (less than 2.3 gm of sodium or less than 6 gm of sodium choride)    Orders Placed This Encounter  Procedures  . CBC with Differential/Platelet  . Lipid Panel With LDL/HDL Ratio  . TSH  . T4, free  . Comprehensive metabolic panel    Meds ordered this encounter  Medications  . fenofibrate (TRICOR) 145 MG tablet    Sig: Take 1 tablet (145 mg total) by mouth daily.    Dispense:  90 tablet    Refill:  3  . zolpidem (AMBIEN) 10 MG tablet    Sig: Take 1 tablet (10 mg total) by mouth at bedtime as needed for sleep.    Dispense:  30 tablet    Refill:  2    Patient would like to fill as 30 day prescription please.    Total time spent: 35 Minutes  Time spent includes review of chart, medications, test results, and follow up plan with the patient.     Lavera Guise, MD  Internal Medicine

## 2020-01-02 ENCOUNTER — Other Ambulatory Visit: Payer: Self-pay

## 2020-01-02 DIAGNOSIS — I10 Essential (primary) hypertension: Secondary | ICD-10-CM

## 2020-01-02 MED ORDER — NEBIVOLOL HCL 20 MG PO TABS: ORAL_TABLET | ORAL | 1 refills | Status: DC

## 2020-03-02 ENCOUNTER — Encounter: Payer: Self-pay | Admitting: Internal Medicine

## 2020-03-02 ENCOUNTER — Other Ambulatory Visit: Payer: Self-pay | Admitting: Internal Medicine

## 2020-03-02 DIAGNOSIS — F5101 Primary insomnia: Secondary | ICD-10-CM

## 2020-03-02 MED ORDER — ZOLPIDEM TARTRATE 10 MG PO TABS: 10.0000 mg | ORAL_TABLET | Freq: Every evening | ORAL | 0 refills | Status: DC | PRN

## 2020-03-26 ENCOUNTER — Ambulatory Visit (INDEPENDENT_AMBULATORY_CARE_PROVIDER_SITE_OTHER): Payer: 59 | Admitting: Physician Assistant

## 2020-03-26 ENCOUNTER — Encounter: Payer: Self-pay | Admitting: Physician Assistant

## 2020-03-26 DIAGNOSIS — R059 Cough, unspecified: Secondary | ICD-10-CM

## 2020-03-26 DIAGNOSIS — J069 Acute upper respiratory infection, unspecified: Secondary | ICD-10-CM

## 2020-03-26 MED ORDER — AZITHROMYCIN 250 MG PO TABS: ORAL_TABLET | ORAL | 0 refills | Status: DC

## 2020-03-26 MED ORDER — BENZONATATE 100 MG PO CAPS: 100.0000 mg | ORAL_CAPSULE | Freq: Two times a day (BID) | ORAL | 0 refills | Status: DC | PRN

## 2020-03-26 NOTE — Progress Notes (Signed)
[facility] [place],  [address]  Internal MEDICINE  Telephone Visit  Patient Name: Brooke Schultz  [id number]  [id number]  Date of Service: 03/29/2020  I connected with the patient at 2:46 by telephone and verified the patients identity using two identifiers.   I discussed the limitations, risks, security and privacy concerns of performing an evaluation and management service by telephone and the availability of in person appointments. I also discussed with the patient that there may be a patient responsible charge related to the service.  The patient expressed understanding and agrees to proceed.    Chief Complaint  Patient presents with  . Cough    With a lot of greenish, yellowish mucus, had a little blood in mucus.  Makes her throat sore  . Telephone Assessment    [phone] virtual   . Telephone Screen    HPI Pt is here for an acute visit.  -Cough with phlegm causing sore throat. Started 2-3 days ago. Green and yellow mucus. Complains of sinus congestion and sneezing. Denies fevers, chills, headaches, body aches. No known exposure. Home covid test was negative. -Oxygen 98%. No SOB. Has tried DM cough syrup. Took tylenol PM last night and it didn't do anything.   Current Medication: Outpatient Encounter Medications as of 03/26/2020  Medication Sig  . Acetaminophen (TYLENOL EXTRA STRENGTH PO) Take 2 tablets by mouth as needed.  Marland Kitchen amLODipine (NORVASC) 2.5 MG tablet Take 1 tablet (2.5 mg total) by mouth daily.  Marland Kitchen azithromycin (ZITHROMAX) 250 MG tablet Take one tab a day for 10 days for uri  . benzonatate (TESSALON) 100 MG capsule Take 1 capsule (100 mg total) by mouth 2 (two) times daily as needed for cough.  . fenofibrate (TRICOR) 145 MG tablet Take 1 tablet (145 mg total) by mouth daily.  Marland Kitchen lisinopril (ZESTRIL) 20 MG tablet Take 1 tablet (20 mg total) by mouth daily.  . Multiple Vitamin (MULTIVITAMIN) tablet Take 1 tablet by mouth daily.  .  Nebivolol HCl (BYSTOLIC) 20 MG TABS Take 1 tab po daily  . zolpidem (AMBIEN) 10 MG tablet Take 1 tablet (10 mg total) by mouth at bedtime as needed for sleep.   No facility-administered encounter medications on file as of 03/26/2020.    Surgical History: Past Surgical History:  Procedure Laterality Date  . ABDOMINAL HYSTERECTOMY    . COLONOSCOPY    . EYE SURGERY    . GALLBLADDER SURGERY    . OVARIAN CYST REMOVAL    . TUBAL LIGATION      Medical History: Past Medical History:  Diagnosis Date  . Hyperlipidemia   . Hypertension   . Insomnia     Family History: Family History  Problem Relation Age of Onset  . Hypertension Mother     Social History   Socioeconomic History  . Marital status: Married    Spouse name: Not on file  . Number of children: Not on file  . Years of education: Not on file  . Highest education level: Not on file  Occupational History  . Not on file  Tobacco Use  . Smoking status: Former Smoker    Types: Cigarettes  . Smokeless tobacco: Never Used  . Tobacco comment: been smoke free for 7weeks  Vaping Use  . Vaping Use: Every day  . Devices: JUUL  Substance and Sexual Activity  . Alcohol use: Yes    Comment: ocassionally  . Drug use: No  . Sexual activity: Not on file  Other Topics Concern  . Not on file  Social History Narrative  . Not on file   Social Determinants of Health   Financial Resource Strain: Not on file  Food Insecurity: Not on file  Transportation Needs: Not on file  Physical Activity: Not on file  Stress: Not on file  Social Connections: Not on file  Intimate Partner Violence: Not on file      Review of Systems  Constitutional: Negative for chills, fatigue and fever.  HENT: Positive for congestion, postnasal drip, sinus pressure, sneezing and sore throat. Negative for ear pain, mouth sores and trouble swallowing.   Respiratory: Positive for cough. Negative for shortness of breath and wheezing.   Cardiovascular:  Negative for chest pain.  Genitourinary: Negative for flank pain.  Musculoskeletal: Negative for myalgias.  Neurological: Negative for light-headedness and headaches.  Psychiatric/Behavioral: Negative.     Vital Signs: Pulse 65   Resp 16   Ht 5\' 4"  (1.626 m)   Wt 235 lb (106.6 kg)   SpO2 98%   BMI 40.34 kg/m    Observation/Objective:  Pt is able to carry out conversation, not in any acute distress.  Assessment/Plan: 1. Acute upper respiratory infection PT has hx of bad reaction to augmentin, will start in azithromycin for 10 days, recommend she use mucinex and flonase. May use tylenol if headache or body aches develop. May take tessalon pearls for cough. - azithromycin (ZITHROMAX) 250 MG tablet; Take one tab a day for 10 days for uri  Dispense: 10 tablet; Refill: 0 - benzonatate (TESSALON) 100 MG capsule; Take 1 capsule (100 mg total) by mouth 2 (two) times daily as needed for cough.  Dispense: 20 capsule; Refill: 0  2. Cough Will start in azithromycin for 10 days, recommend she use mucinex and flonase.  May take tessalon pearls for cough. - azithromycin (ZITHROMAX) 250 MG tablet; Take one tab a day for 10 days for uri  Dispense: 10 tablet; Refill: 0 - benzonatate (TESSALON) 100 MG capsule; Take 1 capsule (100 mg total) by mouth 2 (two) times daily as needed for cough.  Dispense: 20 capsule; Refill: 0   General Counseling: Brooke Schultz verbalizes understanding of the findings of today's phone visit and agrees with plan of treatment. I have discussed any further diagnostic evaluation that may be needed or ordered today. We also reviewed her medications today. she has been encouraged to call the office with any questions or concerns that should arise related to todays visit.    No orders of the defined types were placed in this encounter.   Meds ordered this encounter  Medications  . azithromycin (ZITHROMAX) 250 MG tablet    Sig: Take one tab a day for 10 days for uri     Dispense:  10 tablet    Refill:  0  . benzonatate (TESSALON) 100 MG capsule    Sig: Take 1 capsule (100 mg total) by mouth 2 (two) times daily as needed for cough.    Dispense:  20 capsule    Refill:  0    Time spent:30 Minutes    Dr Lavera Guise Internal medicine

## 2020-03-31 ENCOUNTER — Other Ambulatory Visit: Payer: Self-pay | Admitting: Internal Medicine

## 2020-03-31 DIAGNOSIS — F5101 Primary insomnia: Secondary | ICD-10-CM

## 2020-04-01 ENCOUNTER — Other Ambulatory Visit: Payer: Self-pay | Admitting: Hospice and Palliative Medicine

## 2020-04-01 ENCOUNTER — Encounter: Payer: Self-pay | Admitting: Internal Medicine

## 2020-04-01 ENCOUNTER — Other Ambulatory Visit: Payer: Self-pay | Admitting: Nurse Practitioner

## 2020-04-01 DIAGNOSIS — F5101 Primary insomnia: Secondary | ICD-10-CM

## 2020-04-01 DIAGNOSIS — I10 Essential (primary) hypertension: Secondary | ICD-10-CM

## 2020-04-01 MED ORDER — ZOLPIDEM TARTRATE 10 MG PO TABS: 10.0000 mg | ORAL_TABLET | Freq: Every evening | ORAL | 0 refills | Status: DC | PRN

## 2020-04-01 NOTE — Telephone Encounter (Signed)
This pt seen you on 03/26/20 NOV is with DFK on 04/07/20 not sure if you can send in for pt

## 2020-04-07 ENCOUNTER — Encounter: Payer: Self-pay | Admitting: Physician Assistant

## 2020-04-07 ENCOUNTER — Other Ambulatory Visit: Payer: Self-pay

## 2020-04-07 ENCOUNTER — Ambulatory Visit: Payer: 59 | Admitting: Physician Assistant

## 2020-04-07 DIAGNOSIS — Z6841 Body Mass Index (BMI) 40.0 and over, adult: Secondary | ICD-10-CM

## 2020-04-07 DIAGNOSIS — E782 Mixed hyperlipidemia: Secondary | ICD-10-CM | POA: Diagnosis not present

## 2020-04-07 DIAGNOSIS — I10 Essential (primary) hypertension: Secondary | ICD-10-CM | POA: Diagnosis not present

## 2020-04-07 DIAGNOSIS — R6889 Other general symptoms and signs: Secondary | ICD-10-CM | POA: Diagnosis not present

## 2020-04-07 DIAGNOSIS — F5101 Primary insomnia: Secondary | ICD-10-CM

## 2020-04-07 MED ORDER — AMLODIPINE BESYLATE 2.5 MG PO TABS: 2.5000 mg | ORAL_TABLET | Freq: Every day | ORAL | 3 refills | Status: DC

## 2020-04-07 MED ORDER — LISINOPRIL 20 MG PO TABS: 20.0000 mg | ORAL_TABLET | Freq: Every day | ORAL | 1 refills | Status: DC

## 2020-04-07 MED ORDER — ZOLPIDEM TARTRATE 10 MG PO TABS: 10.0000 mg | ORAL_TABLET | Freq: Every evening | ORAL | 1 refills | Status: DC | PRN

## 2020-04-07 NOTE — Progress Notes (Signed)
[facility] [place], [place] [address]  Internal MEDICINE  Office Visit Note  Patient Name: Brooke Schultz  [id number]  [id number]  Date of Service: 04/07/2020  Chief Complaint  Patient presents with  . Follow-up  . Hyperlipidemia  . Hypertension  . Quality Metric Gaps    Mammogram done in 2021 (Aug. Or Sept.)    HPI Pt is here for f/u. -BP not checked at home unless she feels off -Pt will go for labs then may need to discuss alt to fenofibrate for cholesterol since she cant tolerate statins due to anaphylaxis -Sleeping pattern gets better and then worse. She can sometimes fall asleep but cant stay asleep without ambien. Can sleep maybe 5 hours on ambien. Without it she has vivid dreams and does not get much sleep. She does snore some, but denies waking gasping, or stopping breathing. She does not have excessive daytime sleepiness and states she does not nap or fall asleep in the car. Interested in moving forward with sleep study. She does have an elevated BMI of 41 and HTN.  Current Medication: Outpatient Encounter Medications as of 04/07/2020  Medication Sig  . Acetaminophen (TYLENOL EXTRA STRENGTH PO) Take 2 tablets by mouth as needed.  . fenofibrate (TRICOR) 145 MG tablet Take 1 tablet (145 mg total) by mouth daily.  . Nebivolol HCl (BYSTOLIC) 20 MG TABS Take 1 tab po daily  . [DISCONTINUED] amLODipine (NORVASC) 2.5 MG tablet Take 1 tablet (2.5 mg total) by mouth daily.  . [DISCONTINUED] lisinopril (ZESTRIL) 20 MG tablet Take 1 tablet (20 mg total) by mouth daily.  . [DISCONTINUED] zolpidem (AMBIEN) 10 MG tablet Take 1 tablet (10 mg total) by mouth at bedtime as needed for sleep.  Marland Kitchen amLODipine (NORVASC) 2.5 MG tablet Take 1 tablet (2.5 mg total) by mouth daily.  Marland Kitchen lisinopril (ZESTRIL) 20 MG tablet Take 1 tablet (20 mg total) by mouth daily.  Marland Kitchen zolpidem (AMBIEN) 10 MG tablet Take 1 tablet (10 mg total) by mouth at bedtime as needed for sleep.  .  [DISCONTINUED] azithromycin (ZITHROMAX) 250 MG tablet Take one tab a day for 10 days for uri (Patient not taking: Reported on 04/07/2020)  . [DISCONTINUED] benzonatate (TESSALON) 100 MG capsule Take 1 capsule (100 mg total) by mouth 2 (two) times daily as needed for cough. (Patient not taking: Reported on 04/07/2020)  . [DISCONTINUED] Multiple Vitamin (MULTIVITAMIN) tablet Take 1 tablet by mouth daily. (Patient not taking: Reported on 04/07/2020)   No facility-administered encounter medications on file as of 04/07/2020.    Surgical History: Past Surgical History:  Procedure Laterality Date  . ABDOMINAL HYSTERECTOMY    . COLONOSCOPY    . EYE SURGERY    . GALLBLADDER SURGERY    . OVARIAN CYST REMOVAL    . TUBAL LIGATION      Medical History: Past Medical History:  Diagnosis Date  . Hyperlipidemia   . Hypertension   . Insomnia     Family History: Family History  Problem Relation Age of Onset  . Hypertension Mother     Social History   Socioeconomic History  . Marital status: Married    Spouse name: Not on file  . Number of children: Not on file  . Years of education: Not on file  . Highest education level: Not on file  Occupational History  . Not on file  Tobacco Use  . Smoking status: Former Smoker    Types: Cigarettes  . Smokeless tobacco: Never Used  .  Tobacco comment: been smoke free for 7weeks  Vaping Use  . Vaping Use: Every day  . Devices: JUUL  Substance and Sexual Activity  . Alcohol use: Yes    Comment: ocassionally  . Drug use: No  . Sexual activity: Not on file  Other Topics Concern  . Not on file  Social History Narrative  . Not on file   Social Determinants of Health   Financial Resource Strain: Not on file  Food Insecurity: Not on file  Transportation Needs: Not on file  Physical Activity: Not on file  Stress: Not on file  Social Connections: Not on file  Intimate Partner Violence: Not on file      Review of Systems  Constitutional:  Negative for chills, fatigue and unexpected weight change.  HENT: Negative for congestion, postnasal drip, rhinorrhea, sneezing and sore throat.   Eyes: Negative for redness.  Respiratory: Negative for cough, chest tightness and shortness of breath.   Cardiovascular: Negative for chest pain and palpitations.  Gastrointestinal: Negative for abdominal pain, constipation, diarrhea, nausea and vomiting.  Genitourinary: Negative for dysuria and frequency.  Musculoskeletal: Negative for arthralgias, back pain, joint swelling and neck pain.  Skin: Negative for rash.  Neurological: Negative.  Negative for tremors and numbness.  Hematological: Negative for adenopathy. Does not bruise/bleed easily.  Psychiatric/Behavioral: Positive for sleep disturbance. Negative for behavioral problems (Depression) and suicidal ideas. The patient is not nervous/anxious.     Vital Signs: BP 132/78   Pulse 65   Temp 97.7 F (36.5 C)   Resp 16   Ht 5\' 4"  (1.626 m)   Wt 240 lb 12.8 oz (109.2 kg)   SpO2 98%   BMI 41.33 kg/m    Physical Exam Vitals and nursing note reviewed.  Constitutional:      General: She is not in acute distress.    Appearance: She is well-developed. She is obese. She is not diaphoretic.  HENT:     Head: Normocephalic and atraumatic.     Mouth/Throat:     Pharynx: No oropharyngeal exudate.  Eyes:     Pupils: Pupils are equal, round, and reactive to light.  Neck:     Thyroid: No thyromegaly.     Vascular: No JVD.     Trachea: No tracheal deviation.  Cardiovascular:     Rate and Rhythm: Normal rate and regular rhythm.     Heart sounds: Normal heart sounds. No murmur heard. No friction rub. No gallop.   Pulmonary:     Effort: Pulmonary effort is normal. No respiratory distress.     Breath sounds: No wheezing or rales.  Chest:     Chest wall: No tenderness.  Abdominal:     General: Bowel sounds are normal.     Palpations: Abdomen is soft.  Musculoskeletal:        General:  Normal range of motion.     Cervical back: Normal range of motion and neck supple.  Lymphadenopathy:     Cervical: No cervical adenopathy.  Skin:    General: Skin is warm and dry.  Neurological:     Mental Status: She is alert and oriented to person, place, and time.     Cranial Nerves: No cranial nerve deficit.  Psychiatric:        Behavior: Behavior normal.        Thought Content: Thought content normal.        Judgment: Judgment normal.        Assessment/Plan: 1. Essential hypertension Well  controlled. Continue current medications--refills sent today - amLODipine (NORVASC) 2.5 MG tablet; Take 1 tablet (2.5 mg total) by mouth daily.  Dispense: 90 tablet; Refill: 3 - lisinopril (ZESTRIL) 20 MG tablet; Take 1 tablet (20 mg total) by mouth daily.  Dispense: 90 tablet; Refill: 1  2. Vivid dream Pt has vivid dreams and sleep disruption--takes ambien and still has limited sleep time. Based on this and HTN and BMI >41 would benefit from sleep study - [facility] SLEEP STUDY  3. Primary insomnia Pt has vivid dreams and sleep disruption--takes ambien and still has limited sleep time. Based on this and HTN and BMI >41 would benefit from sleep study. May continue Ambien at night for sleep. - [facility] SLEEP STUDY  4. Mixed hyperlipidemia Continue tricor--pt will go for fasting labs, consider changing to vascepa in future. Pt allergic to statins.  5. BMI 40.0-44.9, adult ([place]) Obesity Counseling: Had a lengthy discussion regarding patients BMI and weight issues. Patient was instructed on portion control as well as increased activity. Also discussed caloric restrictions with trying to maintain intake less than 2000 Kcal. Discussions were made in accordance with the 5As of weight management. Simple actions such as not eating late and if able to, taking a walk is suggested.   General Counseling: melanny wire understanding of the findings of todays visit and agrees with plan of treatment. I have  discussed any further diagnostic evaluation that may be needed or ordered today. We also reviewed her medications today. she has been encouraged to call the office with any questions or concerns that should arise related to todays visit.    Orders Placed This Encounter  Procedures  . [facility] SLEEP STUDY    Meds ordered this encounter  Medications  . amLODipine (NORVASC) 2.5 MG tablet    Sig: Take 1 tablet (2.5 mg total) by mouth daily.    Dispense:  90 tablet    Refill:  3  . lisinopril (ZESTRIL) 20 MG tablet    Sig: Take 1 tablet (20 mg total) by mouth daily.    Dispense:  90 tablet    Refill:  1  . zolpidem (AMBIEN) 10 MG tablet    Sig: Take 1 tablet (10 mg total) by mouth at bedtime as needed for sleep.    Dispense:  30 tablet    Refill:  1    Do not fill until 05/02/20    This patient was seen by Drema Dallas, PA-C in collaboration with Dr. Clayborn Bigness as a part of collaborative care agreement.   Total time spent:30 Minutes Time spent includes review of chart, medications, test results, and follow up plan with the patient.      Dr Lavera Guise Internal medicine

## 2020-05-05 ENCOUNTER — Encounter (INDEPENDENT_AMBULATORY_CARE_PROVIDER_SITE_OTHER): Payer: 59 | Admitting: Internal Medicine

## 2020-05-05 DIAGNOSIS — G4719 Other hypersomnia: Secondary | ICD-10-CM

## 2020-05-07 ENCOUNTER — Encounter (INDEPENDENT_AMBULATORY_CARE_PROVIDER_SITE_OTHER): Payer: 59 | Admitting: Internal Medicine

## 2020-05-07 DIAGNOSIS — G4733 Obstructive sleep apnea (adult) (pediatric): Secondary | ICD-10-CM | POA: Diagnosis not present

## 2020-05-20 NOTE — Procedures (Signed)
[place]  Portable Polysomnogram Report Part 1 Phone: [phone] Fax: [phone]  Patient Name: Brooke Schultz, Brooke Schultz Recording Device: Glee Arvin  D.O.B.: February 27, 1960 Acquisition Number: [id number]  Referring Physician: Drema Dallas. PA-C Acquisition Date: 05/07/2020   History: The patient is a 60 years old female who was referred for re-evaluation of possible sleep apnea.  Medical History: hypertension, hyperlipidemia.  Medications: Tricor, Bystolic, amlodipine, lisinopril, zolpidem.  PROCEDURE  The unattended portable polysomnogram was conducted on the night of 05/07/2020.  The following parameters were monitored: Nasal and oral airflow, and body position. Additionally, thoracic and abdominal movements were recorded by inductance plethysmography. Oxygen saturation (SpO2) and heart rate (ECG) was monitored using a pulse Oximeter.  The tracing was scored using 30 second epochs. Hypopneas were scored per AASM definition VIIID1.B (4% desaturation).   Description: The total recording time was 293.5 minutes. Sleep parameters are not recorded.  Respiratory monitoring demonstrated significant snoring across the night in all positions. There were a total of 71 apneas and hypopneas for a Respiratory Event Index of 14.7 apneas and hypopneas per hour of recording and increased to 22.6 in the supine position. The average duration of the respiratory events was 19.3 seconds with a maximum duration of 58.0 seconds. The respiratory events were associated with peripheral oxygen desaturations on the average to 92 %. The lowest oxygen desaturation associated with a respiratory event was 75 %. Additionally, the mean oxygen saturation was 92 %. The total duration of oxygen < 90% was 28.4 minutes and <80% was 1.6 minutes.   Cardiac monitoring- The average heart rate during the recording was 70.9 bpm.  Impression: This repeat overnight portable polysomnogram demonstrated the presence  of obstructive sleep apnea. Overall the Respiratory Event Index was 14.7 apneas and hypopneas per hour of recording and increased to 22.6 in the supine position.   Recommendations:     1. A CPAP titration in the supine position would be recommended due to the severity of the sleep apnea. 2. Would recommend weight loss in a patient with a BMI of 39.5 lb/in2. The clustering of the respiratory events suggest possible REM related apnea.  Allyne Gee, MD [facility] Diplomate ABMS Pulmonary Critical care Medicine and Sleep Medicine Electronically reviewed and digitally signed  [place]  Portable Polysomnogram Report Part 2 Phone: [phone] Fax: [phone]   Study Date: 05/07/2020  Patient Name: Brooke Schultz, Brooke Schultz Recording Device: Glee Arvin  Sex: F Height: 64.0 in.  D.O.B.: [birth year] Weight: 230.0 lbs.  Age: 60 years B.M.I: 39.5 lb/in2   Times and Durations  Lights off clock time:  12:19:20 AM Total Recording Time (TRT): 293.5 minutes  Lights on clock time: 5:12:50 AM Time In Bed (TIB): 293.5 minutes   Summary  AHI 14.7 OAI 6.9 CAI 0.0 Lowest Desat 75  AHI is the number of apneas and hypopneas per hour. OAI is the number of obstructive apneas per hour. CAI is the number of central apneas per hour. Lowest Desat is the lowest blood oxygen level that lasted at least 2 seconds.  RESPIRATORY EVENTS   Index (#/hour) Total # of Events Mean duration  (sec) Max duration  (sec) # of Events by Position       Supine Prone Left Right Up  Central Apneas 0.0 0 0.0 0.0 0       0 0  Obstructive Apneas 6.9 33 15.2 34.5 3       0 30  Mixed Apneas 0.6 3 16.5 26.0 0  0 3  Hypopneas 7.3 35 23.5 58.0 26       0 9  Apneas + Hypopneas 14.7 71 19.3 58.0 29       0 42  Total 14.7 71 19.3 58.0 29       0 42  Time in Position 77.4       0.2 214.4  AHI in Position 22.6       0.0 11.9    Oximetry Summary   Dur. (min) % TIB  <90 % 28.4 9.7  <85 % 10.2 3.5  <80 % 1.6 0.5  <70 % 0.0 0.0   Total Dur (min) < 89 22.4 min  Average (%) 92  Total # of Desats 119  Desat Index (#/hour) 27.5  Desat Max (%) 13  Desat Max dur (sec) 55.0  Lowest SpO2 % during sleep 75  Duration of Min SpO2 (sec) 4    Heart Rate Stats  Mean HR during sleep (BPM)  Highest HR during sleep 98  (BPM)  Highest HR during TIB  98 (BPM)    Snoring Summary  Total Snoring Episodes 139  Total Duration with Snoring 59.8 minutes  Mean Duration of Snoring 25.8 seconds  Percentage of Snoring 20.7 %

## 2020-05-20 NOTE — Procedures (Signed)
[place]  Portable Polysomnogram Report Part 1 Phone: [phone] Fax: [phone]  Patient Name: Brooke, Schultz Recording Device: Glee Arvin  D.O.B.: 09-16-1960 Acquisition Number: [id number]  Referring Physician: Drema Dallas. PA-C Acquisition Date: 05/05/2020   History: The patient is a 60 years old female who was referred for evaluation of possible sleep apnea.  Medical History: hypertension, hyperlipidemia.  Medications: Tricor, Bystolic, amlodipine, lisinopril, zolpidem.  PROCEDURE  The unattended portable polysomnogram was conducted on the night of 05/05/2020.  The following parameters were monitored: Nasal and oral airflow, and body position. Additionally, thoracic and abdominal movements were recorded by inductance plethysmography. Oxygen saturation (SpO2) and heart rate (ECG) was monitored using a pulse Oximeter.  The tracing was scored using 30 second epochs. Hypopneas were scored per AASM definition VIIID1.B (4% desaturation).    Description: The total recording time was 466.8 minutes. Sleep parameters are not recorded.  Respiratory monitoring demonstrated significant snoring across the night in all positions. There were a total of 8 apneas and hypopneas for a Respiratory Event Index of 2.3 apneas and hypopneas per hour of recording. The average duration of the respiratory events was 19.6 seconds with a maximum duration of 29.5 seconds. The respiratory events were associated with peripheral oxygen desaturations on the average to 88 %. The lowest oxygen desaturation associated with a respiratory event was 68 %. Additionally, the mean oxygen saturation was 88 %. The total duration of oxygen < 90% was 90.0 minutes and <80% was 56.0 minutes.   Cardiac monitoring- The average heart rate during the recording was 80.3 bpm.  Impression: This routine overnight portable polysomnogram did not demonstrate obstructive sleep apnea. Overall the Respiratory  Event Index was 2.3 apneas and hypopneas per hour of recording. The study was technically limited due to the absence of oximetry data for much of the night.   Recommendations:     1. A repeat study is recommended. 2. Would recommend weight loss in a patient with a BMI of 39.5 lb/in2.  Allyne Gee, MD [facility] Diplomate ABMS Pulmonary Critical Care Medicine and Sleep medicine Electronically reviewed and digitally signed  [place]  Portable Polysomnogram Report Part 2 Phone: [phone] Fax: [phone]   Study Date: 05/05/2020  Patient Name: Brooke, Schultz Recording Device: Glee Arvin  Sex: F Height: 64.0 in.  D.O.B.: 05-22-1960 Weight: 230.0 lbs.  Age: 75 years B.M.I: 39.5 lb/in2   Times and Durations  Lights off clock time:  11:58:40 PM Total Recording Time (TRT): 466.8 minutes  Lights on clock time: 7:45:28 AM Time In Bed (TIB): 466.8 minutes   Summary  AHI 2.3 OAI 0.3 CAI 0.0 Lowest Desat 68  AHI is the number of apneas and hypopneas per hour. OAI is the number of obstructive apneas per hour. CAI is the number of central apneas per hour. Lowest Desat is the lowest blood oxygen level that lasted at least 2 seconds.  RESPIRATORY EVENTS   Index (#/hour) Total # of Events Mean duration  (sec) Max duration  (sec) # of Events by Position       Supine Prone Left Right Up  Central Apneas 0.0 0 0.0 0.0 0    0 0 0  Obstructive Apneas 0.3 1 12.0 12.0 0    0 1 0  Mixed Apneas 0.3 1 11.5 11.5 1    0 0 0  Hypopneas 1.8 6 22.2 29.5 1    0 5 0  Apneas + Hypopneas 2.3 8 19.6 29.5 2  0 6 0  Total 2.3 8 19.6 29.5 2    0 6 0  Time in Position 185.4    0.6 17.2 261.3  AHI in Position 0.7    0.0 21.3 0.0    Oximetry Summary   Dur. (min) % TIB  <90 % 90.0 19.3  <85 % 88.3 18.9  <80 % 56.0 12.0  <70 % 21.9 4.7  Total Dur (min) < 89 89.3 min  Average (%) 88  Total # of Desats 32  Desat Index (#/hour) 11.9  Desat Max (%) 5  Desat Max dur (sec) 43.0  Lowest SpO2 %  during sleep 68  Duration of Min SpO2 (sec) 8    Heart Rate Stats  Mean HR during sleep (BPM)  Highest HR during sleep 137  (BPM)  Highest HR during TIB  137 (BPM)    Snoring Summary  Total Snoring Episodes 32  Total Duration with Snoring 15.4 minutes  Mean Duration of Snoring 28.9 seconds  Percentage of Snoring 7.5 %

## 2020-06-30 ENCOUNTER — Other Ambulatory Visit: Payer: Self-pay | Admitting: Nurse Practitioner

## 2020-06-30 ENCOUNTER — Encounter: Payer: Self-pay | Admitting: Internal Medicine

## 2020-06-30 DIAGNOSIS — I10 Essential (primary) hypertension: Secondary | ICD-10-CM

## 2020-07-02 ENCOUNTER — Other Ambulatory Visit: Payer: Self-pay | Admitting: Physician Assistant

## 2020-07-02 ENCOUNTER — Encounter: Payer: Self-pay | Admitting: Physician Assistant

## 2020-07-02 DIAGNOSIS — F5101 Primary insomnia: Secondary | ICD-10-CM

## 2020-07-02 MED ORDER — ZOLPIDEM TARTRATE 10 MG PO TABS: 10.0000 mg | ORAL_TABLET | Freq: Every evening | ORAL | 0 refills | Status: DC | PRN

## 2020-07-09 ENCOUNTER — Ambulatory Visit (INDEPENDENT_AMBULATORY_CARE_PROVIDER_SITE_OTHER): Payer: 59 | Admitting: Physician Assistant

## 2020-07-09 ENCOUNTER — Telehealth: Payer: Self-pay

## 2020-07-09 ENCOUNTER — Other Ambulatory Visit: Payer: Self-pay

## 2020-07-09 ENCOUNTER — Encounter: Payer: Self-pay | Admitting: Physician Assistant

## 2020-07-09 DIAGNOSIS — I10 Essential (primary) hypertension: Secondary | ICD-10-CM

## 2020-07-09 DIAGNOSIS — G4733 Obstructive sleep apnea (adult) (pediatric): Secondary | ICD-10-CM

## 2020-07-09 DIAGNOSIS — F5101 Primary insomnia: Secondary | ICD-10-CM | POA: Diagnosis not present

## 2020-07-09 DIAGNOSIS — Z6841 Body Mass Index (BMI) 40.0 and over, adult: Secondary | ICD-10-CM

## 2020-07-09 DIAGNOSIS — E782 Mixed hyperlipidemia: Secondary | ICD-10-CM

## 2020-07-09 MED ORDER — NEBIVOLOL HCL 20 MG PO TABS: ORAL_TABLET | ORAL | 1 refills | Status: DC

## 2020-07-09 MED ORDER — ZOLPIDEM TARTRATE 10 MG PO TABS: 10.0000 mg | ORAL_TABLET | Freq: Every evening | ORAL | 1 refills | Status: DC | PRN

## 2020-07-09 NOTE — Telephone Encounter (Signed)
LMOM to move up 10/02/20 appt

## 2020-07-09 NOTE — Progress Notes (Addendum)
[facility] [place], [place] [address]  Internal MEDICINE  Office Visit Note  Patient Name: Brooke Schultz  [id number]  [id number]  Date of Service: 07/14/2020  Chief Complaint  Patient presents with   Follow-up   Hyperlipidemia   Hypertension   Quality Metric Gaps    Mammogram, shingrix, covid booster     HPI Pt is here for routine follow up -PSG showed OSA and needs titration study or auto adjusting machine, however she is not ready to move forward with this at this time. Would like to  think about it and will contact office when ready. Discussed the consequences of untreated OSA. -Bp high today, Bp at home 120/130/70-80 since reducing caffeine use. Repeat did not improve however pt is worked up because she just received bill in office that was more than she expected. -Ambien is still helping her sleep -Still needs to have labs done  Current Medication: Outpatient Encounter Medications as of 07/09/2020  Medication Sig   Acetaminophen (TYLENOL EXTRA STRENGTH PO) Take 2 tablets by mouth as needed.   amLODipine (NORVASC) 2.5 MG tablet Take 1 tablet (2.5 mg total) by mouth daily.   fenofibrate (TRICOR) 145 MG tablet Take 1 tablet (145 mg total) by mouth daily.   lisinopril (ZESTRIL) 20 MG tablet Take 1 tablet (20 mg total) by mouth daily.   [DISCONTINUED] Nebivolol HCl (BYSTOLIC) 20 MG TABS Take 1 tab po daily   [DISCONTINUED] zolpidem (AMBIEN) 10 MG tablet Take 1 tablet (10 mg total) by mouth at bedtime as needed for sleep.   Nebivolol HCl (BYSTOLIC) 20 MG TABS Take 1 tab po daily   [START ON 08/01/2020] zolpidem (AMBIEN) 10 MG tablet Take 1 tablet (10 mg total) by mouth at bedtime as needed for sleep.   No facility-administered encounter medications on file as of 07/09/2020.    Surgical History: Past Surgical History:  Procedure Laterality Date   ABDOMINAL HYSTERECTOMY     COLONOSCOPY     EYE SURGERY     GALLBLADDER SURGERY     OVARIAN CYST REMOVAL      TUBAL LIGATION      Medical History: Past Medical History:  Diagnosis Date   Hyperlipidemia    Hypertension    Insomnia     Family History: Family History  Problem Relation Age of Onset   Hypertension Mother     Social History   Socioeconomic History   Marital status: Married    Spouse name: Not on file   Number of children: Not on file   Years of education: Not on file   Highest education level: Not on file  Occupational History   Not on file  Tobacco Use   Smoking status: Former    Pack years: 0.00    Types: Cigarettes   Smokeless tobacco: Never   Tobacco comments:    been smoke free for 7weeks  Vaping Use   Vaping Use: Every day   Devices: JUUL  Substance and Sexual Activity   Alcohol use: Yes    Comment: ocassionally   Drug use: No   Sexual activity: Not on file  Other Topics Concern   Not on file  Social History Narrative   Not on file   Social Determinants of Health   Financial Resource Strain: Not on file  Food Insecurity: Not on file  Transportation Needs: Not on file  Physical Activity: Not on file  Stress: Not on file  Social Connections: Not on file  Intimate  Partner Violence: Not on file      Review of Systems  Constitutional:  Positive for fatigue. Negative for chills and unexpected weight change.  HENT:  Negative for congestion, postnasal drip, rhinorrhea, sneezing and sore throat.   Eyes:  Negative for redness.  Respiratory:  Negative for cough, chest tightness and shortness of breath.   Cardiovascular:  Negative for chest pain and palpitations.  Gastrointestinal:  Negative for abdominal pain, constipation, diarrhea, nausea and vomiting.  Genitourinary:  Negative for dysuria and frequency.  Musculoskeletal:  Negative for arthralgias, back pain, joint swelling and neck pain.  Skin:  Negative for rash.  Neurological: Negative.  Negative for tremors and numbness.  Hematological:  Negative for adenopathy. Does not bruise/bleed  easily.  Psychiatric/Behavioral:  Positive for sleep disturbance. Negative for behavioral problems (Depression) and suicidal ideas. The patient is nervous/anxious.    Vital Signs: BP 140/88 Comment: 150/90  Pulse 70   Temp (!) 97.2 F (36.2 C)   Resp 16   Ht 5\' 4"  (1.626 m)   Wt 241 lb (109.3 kg)   SpO2 98%   BMI 41.37 kg/m    Physical Exam Vitals and nursing note reviewed.  Constitutional:      General: She is not in acute distress.    Appearance: She is well-developed. She is obese. She is not diaphoretic.  HENT:     Head: Normocephalic and atraumatic.     Mouth/Throat:     Pharynx: No oropharyngeal exudate.  Eyes:     Pupils: Pupils are equal, round, and reactive to light.  Neck:     Thyroid: No thyromegaly.     Vascular: No JVD.     Trachea: No tracheal deviation.  Cardiovascular:     Rate and Rhythm: Normal rate and regular rhythm.     Heart sounds: Normal heart sounds. No murmur heard.   No friction rub. No gallop.  Pulmonary:     Effort: Pulmonary effort is normal. No respiratory distress.     Breath sounds: No wheezing or rales.  Chest:     Chest wall: No tenderness.  Abdominal:     General: Bowel sounds are normal.     Palpations: Abdomen is soft.  Musculoskeletal:        General: Normal range of motion.     Cervical back: Normal range of motion and neck supple.  Lymphadenopathy:     Cervical: No cervical adenopathy.  Skin:    General: Skin is warm and dry.  Neurological:     Mental Status: She is alert and oriented to person, place, and time.     Cranial Nerves: No cranial nerve deficit.  Psychiatric:        Behavior: Behavior normal.        Thought Content: Thought content normal.        Judgment: Judgment normal.       Assessment/Plan: 1. Essential hypertension Elevated in office, will monitor closely at home. Continue current medications - Nebivolol HCl (BYSTOLIC) 20 MG TABS; Take 1 tab po daily  Dispense: 90 tablet; Refill: 1  2.  Primary insomnia - zolpidem (AMBIEN) 10 MG tablet; Take 1 tablet (10 mg total) by mouth at bedtime as needed for sleep.  Dispense: 35 tablet; Refill: 1  3. OSA (obstructive sleep apnea) Recommend titration study or APAP. Pt wants to think about it further and will call when ready. Discussed consequences of untreated OSA  4. Mixed hyperlipidemia Needs repeat labs, allergy to statin--consider alternative  5.  BMI 40.0-44.9, adult ([place]) Obesity Counseling: Had a lengthy discussion regarding patients BMI and weight issues. Patient was instructed on portion control as well as increased activity. Also discussed caloric restrictions with trying to maintain intake less than 2000 Kcal. Discussions were made in accordance with the 5As of weight management. Simple actions such as not eating late and if able to, taking a walk is suggested.    General Counseling: staci dack understanding of the findings of todays visit and agrees with plan of treatment. I have discussed any further diagnostic evaluation that may be needed or ordered today. We also reviewed her medications today. she has been encouraged to call the office with any questions or concerns that should arise related to todays visit.    No orders of the defined types were placed in this encounter.   Meds ordered this encounter  Medications   Nebivolol HCl (BYSTOLIC) 20 MG TABS    Sig: Take 1 tab po daily    Dispense:  90 tablet    Refill:  1   zolpidem (AMBIEN) 10 MG tablet    Sig: Take 1 tablet (10 mg total) by mouth at bedtime as needed for sleep.    Dispense:  35 tablet    Refill:  1    Do not fill until 08/01/20     This patient was seen by Drema Dallas, PA-C in collaboration with Dr. Clayborn Bigness as a part of collaborative care agreement.   Total time spent:30 Minutes Time spent includes review of chart, medications, test results, and follow up plan with the patient.      Dr Lavera Guise Internal medicine

## 2020-07-14 NOTE — Patient Instructions (Signed)
Living With Sleep Apnea Sleep apnea is a condition in which breathing pauses or becomes shallow during sleep. Sleep apnea is most commonly caused by a collapsed or blocked airway. People with sleep apnea usually snore loudly. They may have times when they gasp and stop breathing for 10 seconds or more during sleep. This may happenmany times during the night. The breaks in breathing also interrupt the deep sleep that you need to feel rested. Even if you do not completely wake up from the gaps in breathing, your sleep may not be restful and you feel tired during the day. You may also have a headache in the morning and low energy during the day, and you may feel anxiousor depressed. How can sleep apnea affect me? Sleep apnea increases your chances of extreme tiredness during the day (daytime fatigue). It can also increase your risk for health conditions, such as: Heart attack. Stroke. Obesity. Type 2 diabetes. Heart failure. Irregular heartbeat. High blood pressure. If you have daytime fatigue as a result of sleep apnea, you may be more likely to: Perform poorly at school or work. Fall asleep while driving. Have difficulty with attention. Develop depression or anxiety. Have sexual dysfunction. What actions can I take to manage sleep apnea? Sleep apnea treatment  If you were given a device to open your airway while you sleep, use it only as told by your health care provider. You may be given: An oral appliance. This is a custom-made mouthpiece that shifts your lower jaw forward. A continuous positive airway pressure (CPAP) device. This device blows air through a mask when you breathe out (exhale). A nasal expiratory positive airway pressure (EPAP) device. This device has valves that you put into each nostril. A bi-level positive airway pressure (BPAP) device. This device blows air through a mask when you breathe in (inhale) and breathe out (exhale). You may need surgery if other treatments do  not work for you.  Sleep habits Go to sleep and wake up at the same time every day. This helps set your internal clock (circadian rhythm) for sleeping. If you stay up later than usual, such as on weekends, try to get up in the morning within 2 hours of your normal wake time. Try to get at least 7-9 hours of sleep each night. Stop using a computer, tablet, and mobile phone a few hours before bedtime. Do not take long naps during the day. If you nap, limit it to 30 minutes. Have a relaxing bedtime routine. Reading or listening to music may relax you and help you sleep. Use your bedroom only for sleep. Keep your television and computer out of your bedroom. Keep your bedroom cool, dark, and quiet. Use a supportive mattress and pillows. Follow your health care provider's instructions for other changes to sleep habits. Nutrition Do not eat heavy meals in the evening. Do not have caffeine in the later part of the day. The effects of caffeine can last for more than 5 hours. Follow your health care provider's or dietitian's instructions for any diet changes. Lifestyle     Do not drink alcohol before bedtime. Alcohol can cause you to fall asleep at first, but then it can cause you to wake up in the middle of the night and have trouble getting back to sleep. Do not use any products that contain nicotine or tobacco. These products include cigarettes, chewing tobacco, and vaping devices, such as e-cigarettes. If you need help quitting, ask your health care provider. Medicines Take over-the-counter   and prescription medicines only as told by your health care provider. Do not use over-the-counter sleep medicine. You can become dependent on this medicine, and it can make sleep apnea worse. Do not use medicines, such as sedatives and narcotics, unless told by your health care provider. Activity Exercise on most days, but avoid exercising in the evening. Exercising near bedtime can interfere with  sleeping. If possible, spend time outside every day. Natural light helps regulate your circadian rhythm. General information Lose weight if you need to, and maintain a healthy weight. Keep all follow-up visits. This is important. If you are having surgery, make sure to tell your health care provider that you have sleep apnea. You may need to bring your device with you. Where to find more information Learn more about sleep apnea and daytime fatigue from: American Sleep Association: sleepassociation.org National Sleep Foundation: sleepfoundation.org National Heart, Lung, and Blood Institute: nhlbi.nih.gov Summary Sleep apnea is a condition in which breathing pauses or becomes shallow during sleep. Sleep apnea can cause daytime fatigue and other serious health conditions. You may need to wear a device while sleeping to help keep your airway open. If you are having surgery, make sure to tell your health care provider that you have sleep apnea. You may need to bring your device with you. Making changes to sleep habits, diet, lifestyle, and activity can help you manage sleep apnea. This information is not intended to replace advice given to you by your health care provider. Make sure you discuss any questions you have with your healthcare provider. Document Revised: 11/29/2019 Document Reviewed: 11/29/2019 Elsevier Patient Education  2022 Elsevier Inc.  

## 2020-09-17 ENCOUNTER — Telehealth: Payer: Self-pay

## 2020-09-17 NOTE — Telephone Encounter (Signed)
Per [facility] fax received patient declined to schedule 2nd study (cpap titration)

## 2020-09-28 ENCOUNTER — Other Ambulatory Visit: Payer: Self-pay | Admitting: Physician Assistant

## 2020-09-28 DIAGNOSIS — I10 Essential (primary) hypertension: Secondary | ICD-10-CM

## 2020-10-02 ENCOUNTER — Other Ambulatory Visit: Payer: Self-pay

## 2020-10-02 ENCOUNTER — Ambulatory Visit: Payer: 59 | Admitting: Nurse Practitioner

## 2020-10-02 ENCOUNTER — Encounter: Payer: Self-pay | Admitting: Physician Assistant

## 2020-10-05 ENCOUNTER — Other Ambulatory Visit: Payer: Self-pay | Admitting: Internal Medicine

## 2020-10-05 DIAGNOSIS — F5101 Primary insomnia: Secondary | ICD-10-CM

## 2020-10-05 MED ORDER — ZOLPIDEM TARTRATE 10 MG PO TABS: 10.0000 mg | ORAL_TABLET | Freq: Every evening | ORAL | 0 refills | Status: DC | PRN

## 2020-12-21 ENCOUNTER — Ambulatory Visit (INDEPENDENT_AMBULATORY_CARE_PROVIDER_SITE_OTHER): Payer: 59 | Admitting: Internal Medicine

## 2020-12-21 ENCOUNTER — Encounter: Payer: Self-pay | Admitting: Internal Medicine

## 2020-12-21 VITALS — BP 136/82 | HR 96 | Temp 98.0°F | Resp 16 | Ht 64.0 in | Wt 242.6 lb

## 2020-12-21 DIAGNOSIS — F5101 Primary insomnia: Secondary | ICD-10-CM

## 2020-12-21 DIAGNOSIS — Z1231 Encounter for screening mammogram for malignant neoplasm of breast: Secondary | ICD-10-CM | POA: Diagnosis not present

## 2020-12-21 DIAGNOSIS — Z1211 Encounter for screening for malignant neoplasm of colon: Secondary | ICD-10-CM

## 2020-12-21 DIAGNOSIS — E782 Mixed hyperlipidemia: Secondary | ICD-10-CM

## 2020-12-21 DIAGNOSIS — I10 Essential (primary) hypertension: Secondary | ICD-10-CM | POA: Diagnosis not present

## 2020-12-21 MED ORDER — FENOFIBRATE 145 MG PO TABS
145.0000 mg | ORAL_TABLET | Freq: Every day | ORAL | 3 refills | Status: DC
Start: 2020-12-21 — End: 2021-09-24

## 2020-12-21 MED ORDER — FLUTICASONE PROPIONATE 50 MCG/ACT NA SUSP: 2.0000 | Freq: Every day | NASAL | 6 refills | Status: DC

## 2020-12-21 MED ORDER — ZOLPIDEM TARTRATE 5 MG PO TABS: 5.0000 mg | ORAL_TABLET | Freq: Every evening | ORAL | 0 refills | Status: DC | PRN

## 2020-12-21 MED ORDER — AMLODIPINE BESYLATE 2.5 MG PO TABS
2.5000 mg | ORAL_TABLET | Freq: Every day | ORAL | 3 refills | Status: DC
Start: 2020-12-21 — End: 2021-12-23

## 2020-12-21 MED ORDER — NEBIVOLOL HCL 20 MG PO TABS: ORAL_TABLET | ORAL | 1 refills | Status: DC

## 2020-12-21 MED ORDER — LISINOPRIL 20 MG PO TABS: 20.0000 mg | ORAL_TABLET | Freq: Every day | ORAL | 3 refills | Status: DC

## 2020-12-21 NOTE — Patient Instructions (Signed)
It was great seeing you today!  Plan discussed at today's visit: -Blood work ordered today, results will be uploaded to [place] sent to [facility] to be scheduled -GI referral placed   Follow up in: 3 months   Take care and let us know if you have any questions or concerns prior to your next visit.  Dr. Rosana Berger

## 2020-12-21 NOTE — Progress Notes (Signed)
New Patient Office Visit  Subjective:  Patient ID: Brooke Schultz, female    DOB: 10/03/1960  Age: 60 y.o. MRN: [id number]  CC:  Chief Complaint  Patient presents with   Establish Care    HPI Brooke Schultz presents as a new patient. Had a recent URI in November, treated with steroids and antibiotics. Still feels congested in face. Productive clear cough. No sinus pain/pressure.  Hypertension: -Medications: Lisinopril 20, Bystotlic 20, Amlodipine 2.5 -Patient is compliant with above medications and reports no side effects. -Checking BP at home (average): 130-140/80-90 -Does get headaches with high blood pressure, but not recently. Denies any SOB, CP, vision changes, LE edema or symptoms of hypotension -Diet: drinks a lot  diet coke   HLD: -Medications: Fenofibrate 145. 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  -Last lipid panel: Unsure   Insomnia: -Currently on Ambien 10 mg, used to take every night  -Been on 25 years  -Had a recent sleep study but never found out results, denies snoring or apneic events  Health Maintenance: -Blood work: due -Mammogram due, no history of abnormal mammograms  -Colon cancer screening: Cologuard positive then had colonoscopy, was told to repeat in 3 years  Past Medical History:  Diagnosis Date   Hyperlipidemia    Hypertension    Insomnia     Past Surgical History:  Procedure Laterality Date   ABDOMINAL HYSTERECTOMY     COLONOSCOPY     EYE SURGERY     GALLBLADDER SURGERY     OVARIAN CYST REMOVAL     TUBAL LIGATION      Family History  Problem Relation Age of Onset   Diabetes Mother    Hypertension Mother    Stroke Brother    COPD Maternal Grandmother     Social History   Socioeconomic History   Marital status: Married    Spouse name: Not on file   Number of children: Not on file   Years of education: Not on file   Highest education level: Not on file   Occupational History   Not on file  Tobacco Use   Smoking status: Former    Types: Cigarettes   Smokeless tobacco: Never   Tobacco comments:    been smoke free for 7weeks  Vaping Use   Vaping Use: Former   Devices: JUUL  Substance and Sexual Activity   Alcohol use: Yes    Comment: ocassionally   Drug use: No   Sexual activity: Yes  Other Topics Concern   Not on file  Social History Narrative   Not on file   Social Determinants of Health   Financial Resource Strain: Not on file  Food Insecurity: Not on file  Transportation Needs: Not on file  Physical Activity: Not on file  Stress: Not on file  Social Connections: Not on file  Intimate Partner Violence: Not on file    ROS Review of Systems  Constitutional:  Negative for chills and fever.  HENT:  Positive for congestion and postnasal drip. Negative for sinus pressure and sinus pain.   Respiratory:  Positive for cough. Negative for shortness of breath.   Cardiovascular:  Negative for chest pain and palpitations.  Gastrointestinal:  Negative for abdominal pain, nausea and vomiting.  Neurological:  Negative for dizziness and headaches.  Psychiatric/Behavioral:  Positive for sleep disturbance.    Objective:   Today's Vitals: BP 136/82    Pulse 96    Temp 98  F (36.7 C)    Resp 16    Ht 5\' 4"  (1.626 m)    Wt 242 lb 9.6 oz (110 kg)    SpO2 96%    BMI 41.64 kg/m   Physical Exam Constitutional:      Appearance: Normal appearance.  HENT:     Head: Normocephalic and atraumatic.     Mouth/Throat:     Mouth: Mucous membranes are moist.     Pharynx: Oropharynx is clear.     Comments: Small airway Eyes:     Conjunctiva/sclera: Conjunctivae normal.  Cardiovascular:     Rate and Rhythm: Normal rate and regular rhythm.  Pulmonary:     Effort: Pulmonary effort is normal.     Breath sounds: Normal breath sounds.  Abdominal:     General: There is no distension.     Palpations: Abdomen is soft.     Tenderness: There is  no abdominal tenderness.  Musculoskeletal:     Right lower leg: No edema.     Left lower leg: No edema.  Skin:    General: Skin is warm and dry.  Neurological:     General: No focal deficit present.     Mental Status: She is alert. Mental status is at baseline.  Psychiatric:        Mood and Affect: Mood normal.        Behavior: Behavior normal.    Assessment & Plan:   1. Encounter for screening mammogram for malignant neoplasm of breast: Mammogram ordered today.  - MM 3D SCREEN BREAST BILATERAL; Future  2. Essential hypertension: Blood pressure stable, refill medications and due for blood work.   - CBC w/Diff/Platelet - COMPLETE METABOLIC PANEL WITH GFR - amLODipine (NORVASC) 2.5 MG tablet; Take 1 tablet (2.5 mg total) by mouth daily.  Dispense: 90 tablet; Refill: 3 - lisinopril (ZESTRIL) 20 MG tablet; Take 1 tablet (20 mg total) by mouth daily.  Dispense: 90 tablet; Refill: 3 - Nebivolol HCl (BYSTOLIC) 20 MG TABS; Take 1 tab po daily  Dispense: 90 tablet; Refill: 1  3. Mixed hyperlipidemia: Refill Tricor (adverse reaction to statin in the past). Recheck lipid panel.  - Lipid Profile - fenofibrate (TRICOR) 145 MG tablet; Take 1 tablet (145 mg total) by mouth daily.  Dispense: 90 tablet; Refill: 3  4. Colon cancer screening: Due for repeat colonoscopy. - Ambulatory referral to Gastroenterology  5. Primary insomnia: Discussed sleep hygiene and alternatives to Ambien. Also discussed sleep study, as undiagnosed OSA can greatly impact sleep and HTN.  Will refill Ambien but at decreased dose and she will try not to take it every night for sleep. Follow up in 3 months.   - zolpidem (AMBIEN) 5 MG tablet; Take 1 tablet (5 mg total) by mouth at bedtime as needed for sleep.  Dispense: 90 tablet; Refill: 0   Follow-up: Return in about 3 months (around 03/21/2021).   Teodora Medici, DO

## 2020-12-22 LAB — COMPLETE METABOLIC PANEL WITH GFR
AG Ratio: 1.4 (calc) (ref 1.0–2.5)
ALT: 19 U/L (ref 6–29)
AST: 19 U/L (ref 10–35)
Albumin: 4.1 g/dL (ref 3.6–5.1)
Alkaline phosphatase (APISO): 49 U/L (ref 37–153)
BUN: 14 mg/dL (ref 7–25)
CO2: 27 mmol/L (ref 20–32)
Calcium: 9.1 mg/dL (ref 8.6–10.4)
Chloride: 100 mmol/L (ref 98–110)
Creat: 0.69 mg/dL (ref 0.50–1.03)
Globulin: 2.9 g/dL (calc) (ref 1.9–3.7)
Glucose, Bld: 135 mg/dL — ABNORMAL HIGH (ref 65–99)
Potassium: 4 mmol/L (ref 3.5–5.3)
Sodium: 140 mmol/L (ref 135–146)
Total Bilirubin: 0.4 mg/dL (ref 0.2–1.2)
Total Protein: 7 g/dL (ref 6.1–8.1)
eGFR: 101 mL/min/{1.73_m2} (ref >=60)

## 2020-12-22 LAB — CBC WITH DIFFERENTIAL/PLATELET
Absolute Monocytes: 390 cells/uL (ref 200–950)
Basophils Absolute: 29 cells/uL (ref 0–200)
Basophils Relative: 0.7 %
Eosinophils Absolute: 139 cells/uL (ref 15–500)
Eosinophils Relative: 3.4 %
HCT: 37.9 % (ref 35.0–45.0)
Hemoglobin: 12.6 g/dL (ref 11.7–15.5)
Lymphs Abs: 1726 cells/uL (ref 850–3900)
MCH: 29.3 pg (ref 27.0–33.0)
MCHC: 33.2 g/dL (ref 32.0–36.0)
MCV: 88.1 fL (ref 80.0–100.0)
MPV: 9.4 fL (ref 7.5–12.5)
Monocytes Relative: 9.5 %
Neutro Abs: 1816 cells/uL (ref 1500–7800)
Neutrophils Relative %: 44.3 %
Platelets: 395 10*3/uL (ref 140–400)
RBC: 4.3 10*6/uL (ref 3.80–5.10)
RDW: 12.5 % (ref 11.0–15.0)
Total Lymphocyte: 42.1 %
WBC: 4.1 10*3/uL (ref 3.8–10.8)

## 2020-12-22 LAB — LIPID PANEL
Cholesterol: 225 mg/dL — ABNORMAL HIGH (ref <200)
HDL: 45 mg/dL — ABNORMAL LOW (ref >=50)
LDL Cholesterol (Calc): 141 mg/dL (calc) — ABNORMAL HIGH
Non-HDL Cholesterol (Calc): 180 mg/dL (calc) — ABNORMAL HIGH (ref <130)
Total CHOL/HDL Ratio: 5 (calc) — ABNORMAL HIGH (ref <5.0)
Triglycerides: 239 mg/dL — ABNORMAL HIGH (ref <150)

## 2020-12-29 ENCOUNTER — Telehealth: Payer: Self-pay

## 2020-12-29 NOTE — Telephone Encounter (Signed)
CALLED PATIENT NO ANSWER LEFT VOICEMAIL FOR A CALL BACK ? ?

## 2021-01-18 ENCOUNTER — Other Ambulatory Visit: Payer: Self-pay

## 2021-01-18 ENCOUNTER — Ambulatory Visit: Payer: 59

## 2021-02-02 ENCOUNTER — Other Ambulatory Visit: Payer: Self-pay

## 2021-02-02 DIAGNOSIS — Z1211 Encounter for screening for malignant neoplasm of colon: Secondary | ICD-10-CM

## 2021-02-02 MED ORDER — NA SULFATE-K SULFATE-MG SULF 17.5-3.13-1.6 GM/177ML PO SOLN: 1.0000 | Freq: Once | ORAL | 0 refills | Status: AC

## 2021-02-02 NOTE — Progress Notes (Signed)
Gastroenterology Pre-Procedure Review  Request Date: 02/18/2021 Requesting Physician: Dr. Vicente Males   PATIENT REVIEW QUESTIONS: The patient responded to the following health history questions as indicated:    1. Are you having any GI issues? no 2. Do you have a personal history of Polyps? no 3. Do you have a family history of Colon Cancer or Polyps? no 4. Diabetes Mellitus? no 5. Joint replacements in the past 12 months?no 6. Major health problems in the past 3 months?no 7. Any artificial heart valves, MVP, or defibrillator?no    MEDICATIONS & ALLERGIES:    Patient reports the following regarding taking any anticoagulation/antiplatelet therapy:   Plavix, Coumadin, Eliquis, Xarelto, Lovenox, Pradaxa, Brilinta, or Effient? no Aspirin? no  Patient confirms/reports the following medications:  Current Outpatient Medications  Medication Sig Dispense Refill   Acetaminophen (TYLENOL EXTRA STRENGTH PO) Take 2 tablets by mouth as needed.     amLODipine (NORVASC) 2.5 MG tablet Take 1 tablet (2.5 mg total) by mouth daily. 90 tablet 3   fenofibrate (TRICOR) 145 MG tablet Take 1 tablet (145 mg total) by mouth daily. 90 tablet 3   fluticasone (FLONASE) 50 MCG/ACT nasal spray Place 2 sprays into both nostrils daily. 16 g 6   lisinopril (ZESTRIL) 20 MG tablet Take 1 tablet (20 mg total) by mouth daily. 90 tablet 3   Nebivolol HCl (BYSTOLIC) 20 MG TABS Take 1 tab po daily 90 tablet 1   zolpidem (AMBIEN) 5 MG tablet Take 1 tablet (5 mg total) by mouth at bedtime as needed for sleep. 90 tablet 0   No current facility-administered medications for this visit.    Patient confirms/reports the following allergies:  Allergies  Allergen Reactions   Statins Anaphylaxis   Bactrim [Sulfamethoxazole-Trimethoprim]     No orders of the defined types were placed in this encounter.   AUTHORIZATION INFORMATION Primary Insurance: 1D#: Group #:  Secondary Insurance: 1D#: Group #:  SCHEDULE  INFORMATION: Date:02/18/2021  Time: Location:[facility]

## 2021-02-18 ENCOUNTER — Ambulatory Visit: Payer: 59 | Admitting: Anesthesiology

## 2021-02-18 ENCOUNTER — Ambulatory Visit
Admission: RE | Admit: 2021-02-18 | Discharge: 2021-02-18 | Disposition: A | Discharge status: Home / Self Care | Payer: 59 | Attending: Gastroenterology | Admitting: Gastroenterology

## 2021-02-18 ENCOUNTER — Encounter
Admission: RE | Disposition: A | Discharge status: Home / Self Care | Payer: Self-pay | Source: Home / Self Care | Attending: Gastroenterology

## 2021-02-18 ENCOUNTER — Other Ambulatory Visit: Payer: Self-pay

## 2021-02-18 ENCOUNTER — Encounter: Payer: Self-pay | Admitting: Gastroenterology

## 2021-02-18 DIAGNOSIS — D122 Benign neoplasm of ascending colon: Secondary | ICD-10-CM | POA: Insufficient documentation

## 2021-02-18 DIAGNOSIS — Z6841 Body Mass Index (BMI) 40.0 and over, adult: Secondary | ICD-10-CM | POA: Diagnosis not present

## 2021-02-18 DIAGNOSIS — Z1211 Encounter for screening for malignant neoplasm of colon: Secondary | ICD-10-CM | POA: Insufficient documentation

## 2021-02-18 DIAGNOSIS — G4700 Insomnia, unspecified: Secondary | ICD-10-CM | POA: Insufficient documentation

## 2021-02-18 DIAGNOSIS — K635 Polyp of colon: Secondary | ICD-10-CM | POA: Diagnosis not present

## 2021-02-18 DIAGNOSIS — I10 Essential (primary) hypertension: Secondary | ICD-10-CM | POA: Diagnosis not present

## 2021-02-18 DIAGNOSIS — Z87891 Personal history of nicotine dependence: Secondary | ICD-10-CM | POA: Diagnosis not present

## 2021-02-18 HISTORY — PX: COLONOSCOPY WITH PROPOFOL: SHX5780

## 2021-02-18 SURGERY — COLONOSCOPY WITH PROPOFOL
Anesthesia: General

## 2021-02-18 MED ORDER — PROPOFOL 10 MG/ML IV BOLUS
INTRAVENOUS | Status: DC | PRN
  Administered 2021-02-18: 20 mg via INTRAVENOUS
  Administered 2021-02-18: 60 mg via INTRAVENOUS
  Administered 2021-02-18: 20 mg via INTRAVENOUS

## 2021-02-18 MED ORDER — GLYCOPYRROLATE 0.2 MG/ML IJ SOLN
INTRAMUSCULAR | Status: DC | PRN
  Administered 2021-02-18: .2 mg via INTRAVENOUS

## 2021-02-18 MED ORDER — LIDOCAINE HCL (CARDIAC) PF 100 MG/5ML IV SOSY
PREFILLED_SYRINGE | INTRAVENOUS | Status: DC | PRN
Start: 2021-02-18 — End: 2021-02-18
  Administered 2021-02-18: 100 mg via INTRAVENOUS

## 2021-02-18 MED ORDER — PROPOFOL 500 MG/50ML IV EMUL
INTRAVENOUS | Status: AC
  Filled 2021-02-18: qty 600

## 2021-02-18 MED ORDER — SODIUM CHLORIDE 0.9 % IV SOLN: INTRAVENOUS | Status: DC

## 2021-02-18 MED ORDER — PROPOFOL 500 MG/50ML IV EMUL
INTRAVENOUS | Status: DC | PRN
Start: 2021-02-18 — End: 2021-02-18
  Administered 2021-02-18: 155 ug/kg/min via INTRAVENOUS

## 2021-02-18 NOTE — Anesthesia Postprocedure Evaluation (Signed)
Anesthesia Post Note  Patient: Jay Kempe  Procedure(s) Performed: COLONOSCOPY WITH PROPOFOL  Patient location during evaluation: Endoscopy Anesthesia Type: General Level of consciousness: awake and alert Pain management: pain level controlled Vital Signs Assessment: post-procedure vital signs reviewed and stable Respiratory status: spontaneous breathing, nonlabored ventilation, respiratory function stable and patient connected to nasal cannula oxygen Cardiovascular status: blood pressure returned to baseline and stable Postop Assessment: no apparent nausea or vomiting Anesthetic complications: no   No notable events documented.   Last Vitals:  Vitals:   02/18/21 0818 02/18/21 0828  BP: 119/78 117/76  Pulse: 85 (!) 47  Resp: (!) 25 15  Temp:    SpO2: 96% 92%    Last Pain:  Vitals:   02/18/21 0828  TempSrc:   PainSc: 0-No pain                 Precious Haws Leocadia Idleman

## 2021-02-18 NOTE — Anesthesia Preprocedure Evaluation (Signed)
Anesthesia Evaluation  Patient identified by MRN, date of birth, ID band Patient awake    Reviewed: Allergy & Precautions, NPO status , Patient's Chart, lab work & pertinent test results  History of Anesthesia Complications Negative for: history of anesthetic complications  Airway Mallampati: III  TM Distance: <3 FB Neck ROM: full    Dental  (+) Chipped, Poor Dentition, Missing, Upper Dentures, Partial Lower   Pulmonary neg pulmonary ROS, neg shortness of breath, former smoker,    Pulmonary exam normal        Cardiovascular Exercise Tolerance: Good hypertension, (-) anginaNormal cardiovascular exam     Neuro/Psych PSYCHIATRIC DISORDERS negative neurological ROS  negative psych ROS   GI/Hepatic negative GI ROS, Neg liver ROS, neg GERD  ,  Endo/Other  Morbid obesity  Renal/GU negative Renal ROS  negative genitourinary   Musculoskeletal   Abdominal   Peds  Hematology negative hematology ROS (+)   Anesthesia Other Findings Past Medical History: No date: Hyperlipidemia No date: Hypertension No date: Insomnia  Past Surgical History: No date: ABDOMINAL HYSTERECTOMY No date: COLONOSCOPY No date: EYE SURGERY No date: GALLBLADDER SURGERY No date: OVARIAN CYST REMOVAL No date: TUBAL LIGATION     Reproductive/Obstetrics negative OB ROS                             Anesthesia Physical Anesthesia Plan  ASA: 3  Anesthesia Plan: General   Post-op Pain Management:    Induction: Intravenous  PONV Risk Score and Plan: Propofol infusion and TIVA  Airway Management Planned: Natural Airway and Nasal Cannula  Additional Equipment:   Intra-op Plan:   Post-operative Plan:   Informed Consent: I have reviewed the patients History and Physical, chart, labs and discussed the procedure including the risks, benefits and alternatives for the proposed anesthesia with the patient or authorized  representative who has indicated his/her understanding and acceptance.     Dental Advisory Given  Plan Discussed with: Anesthesiologist, CRNA and Surgeon  Anesthesia Plan Comments: (Patient consented for risks of anesthesia including but not limited to:  - adverse reactions to medications - risk of airway placement if required - damage to eyes, teeth, lips or other oral mucosa - nerve damage due to positioning  - sore throat or hoarseness - Damage to heart, brain, nerves, lungs, other parts of body or loss of life  Patient voiced understanding.)        Anesthesia Quick Evaluation

## 2021-02-18 NOTE — Anesthesia Procedure Notes (Signed)
Procedure Name: General with mask airway Date/Time: 02/18/2021 8:05 AM Performed by: Kelton Pillar, CRNA Pre-anesthesia Checklist: Patient identified, Emergency Drugs available, Suction available and Patient being monitored Patient Re-evaluated:Patient Re-evaluated prior to induction Oxygen Delivery Method: Simple face mask Induction Type: IV induction Placement Confirmation: positive ETCO2, CO2 detector and breath sounds checked- equal and bilateral Dental Injury: Teeth and Oropharynx as per pre-operative assessment

## 2021-02-18 NOTE — Transfer of Care (Signed)
Immediate Anesthesia Transfer of Care Note  Patient: Brooke Schultz  Procedure(s) Performed: COLONOSCOPY WITH PROPOFOL  Patient Location: Endoscopy Unit  Anesthesia Type:General  Level of Consciousness: awake, drowsy and patient cooperative  Airway & Oxygen Therapy: Patient Spontanous Breathing and Patient connected to face mask oxygen  Post-op Assessment: Report given to RN and Post -op Vital signs reviewed and stable  Post vital signs: Reviewed and stable  Last Vitals:  Vitals Value Taken Time  BP 113/60 02/18/21 0809  Temp    Pulse 91 02/18/21 0809  Resp 17 02/18/21 0809  SpO2 100 % 02/18/21 0809  Vitals shown include unvalidated device data.  Last Pain:  Vitals:   02/18/21 0727  TempSrc: Temporal  PainSc: 0-No pain         Complications: No notable events documented.

## 2021-02-18 NOTE — Op Note (Signed)
[facility] Gastroenterology Patient Name: Brooke Schultz Procedure Date: 02/18/2021 7:44 AM MRN: [id number] Account #: [id number] Date of Birth: [birth year] Admit Type: Outpatient Age: 61 Room: [facility] ENDO ROOM 3 Gender: Female Note Status: Finalized Instrument Name: Jasper Riling [id number] Procedure:             Colonoscopy Indications:           Screening for colorectal malignant neoplasm Providers:             Jonathon Bellows MD, MD Referring MD:          No Local Md, MD (Referring MD) Medicines:             Monitored Anesthesia Care Complications:         No immediate complications. Procedure:             Pre-Anesthesia Assessment:                        - Prior to the procedure, a History and Physical was                         performed, and patient medications, allergies and                         sensitivities were reviewed. The patient's tolerance                         of previous anesthesia was reviewed.                        - The risks and benefits of the procedure and the                         sedation options and risks were discussed with the                         patient. All questions were answered and informed                         consent was obtained.                        - ASA Grade Assessment: II - A patient with mild                         systemic disease.                        After obtaining informed consent, the colonoscope was                         passed under direct vision. Throughout the procedure,                         the patient's blood pressure, pulse, and oxygen                         saturations were monitored continuously. The                         Colonoscope was  introduced through the anus and                         advanced to the the cecum, identified by the                         appendiceal orifice. The colonoscopy was performed                         with ease. The patient tolerated the procedure well.                          The quality of the bowel preparation was excellent. Findings:      The perianal and digital rectal examinations were normal.      Two sessile polyps were found in the recto-sigmoid colon. The polyps       were 4 to 5 mm in size. These polyps were removed with a cold snare.       Resection and retrieval were complete.      A 15 mm polyp was found in the ascending colon. The polyp was sessile.       Preparations were made for mucosal resection. Saline was injected to       raise the lesion. Snare mucosal resection was performed. Resection and       retrieval were complete. To close a defect after mucosal resection, one       hemostatic clip was successfully placed. There was no bleeding during,       or at the end, of the procedure. Borders of the polyp marked using blue       or nbi light      The exam was otherwise without abnormality on direct and retroflexion       views. Impression:            - Two 4 to 5 mm polyps at the recto-sigmoid colon,                         removed with a cold snare. Resected and retrieved.                        - One 15 mm polyp in the ascending colon, removed with                         mucosal resection. Resected and retrieved. Clip was                         placed.                        - The examination was otherwise normal on direct and                         retroflexion views.                        - Mucosal resection was performed. Resection and                         retrieval were complete. Recommendation:        - Discharge patient  to home (with escort).                        - Resume previous diet.                        - Continue present medications.                        - Await pathology results.                        - Repeat colonoscopy in 3 years for surveillance. Procedure Code(s):     --- Professional ---                        [phone], Colonoscopy, flexible; with endoscopic mucosal                          resection                        [id number], 33, Colonoscopy, flexible; with removal of                         tumor(s), polyp(s), or other lesion(s) by snare                         technique Diagnosis Code(s):     --- Professional ---                        K63.5, Polyp of colon                        Z12.11, Encounter for screening for malignant neoplasm                         of colon CPT copyright 2019 American Medical Association. All rights reserved. The codes documented in this report are preliminary and upon coder review may  be revised to meet current compliance requirements. Jonathon Bellows, MD Jonathon Bellows MD, MD 02/18/2021 8:08:14 AM This report has been signed electronically. Number of Addenda: 0 Note Initiated On: 02/18/2021 7:44 AM Scope Withdrawal Time: 0 hours 14 minutes 46 seconds  Total Procedure Duration: 0 hours 16 minutes 8 seconds  Estimated Blood Loss:  Estimated blood loss: none.      [facility]

## 2021-02-18 NOTE — H&P (Signed)
Jonathon Bellows, MD [address] [place], [place], [place], [address] [address], [place], [place], [place], [address] Phone: [phone]  Fax: [phone]  Primary Care Physician:  Teodora Medici, DO   Pre-Procedure History & Physical: HPI:  Brooke Schultz is a 61 y.o. female is here for an colonoscopy.   Past Medical History:  Diagnosis Date   Hyperlipidemia    Hypertension    Insomnia     Past Surgical History:  Procedure Laterality Date   ABDOMINAL HYSTERECTOMY     COLONOSCOPY     EYE SURGERY     GALLBLADDER SURGERY     OVARIAN CYST REMOVAL     TUBAL LIGATION      Prior to Admission medications   Medication Sig Start Date End Date Taking? Authorizing Provider  amLODipine (NORVASC) 2.5 MG tablet Take 1 tablet (2.5 mg total) by mouth daily. 12/21/20  Yes Teodora Medici, DO  fenofibrate (TRICOR) 145 MG tablet Take 1 tablet (145 mg total) by mouth daily. 12/21/20  Yes Teodora Medici, DO  lisinopril (ZESTRIL) 20 MG tablet Take 1 tablet (20 mg total) by mouth daily. 12/21/20  Yes Teodora Medici, DO  zolpidem (AMBIEN) 5 MG tablet Take 1 tablet (5 mg total) by mouth at bedtime as needed for sleep. 12/21/20  Yes Teodora Medici, DO  Acetaminophen (TYLENOL EXTRA STRENGTH PO) Take 2 tablets by mouth as needed.    [provider]  fluticasone (FLONASE) 50 MCG/ACT nasal spray Place 2 sprays into both nostrils daily. 12/21/20   Teodora Medici, DO  Nebivolol HCl (BYSTOLIC) 20 MG TABS Take 1 tab po daily 12/21/20   Teodora Medici, DO    Allergies as of 02/02/2021 - Review Complete 02/02/2021  Allergen Reaction Noted   Statins Anaphylaxis 08/22/2016   Bactrim [sulfamethoxazole-trimethoprim]  12/07/2016    Family History  Problem Relation Age of Onset   Diabetes Mother    Hypertension Mother    Stroke Brother    COPD Maternal Grandmother     Social History   Socioeconomic History   Marital status: Married    Spouse name: Not  on file   Number of children: Not on file   Years of education: Not on file   Highest education level: Not on file  Occupational History   Not on file  Tobacco Use   Smoking status: Former    Types: Cigarettes   Smokeless tobacco: Never   Tobacco comments:    been smoke free for 7weeks  Vaping Use   Vaping Use: Former   Devices: JUUL  Substance and Sexual Activity   Alcohol use: Yes    Comment: ocassionally,none last 24hrs   Drug use: No   Sexual activity: Yes  Other Topics Concern   Not on file  Social History Narrative   Not on file   Social Determinants of Health   Financial Resource Strain: Not on file  Food Insecurity: Not on file  Transportation Needs: Not on file  Physical Activity: Not on file  Stress: Not on file  Social Connections: Not on file  Intimate Partner Violence: Not on file    Review of Systems: See HPI, otherwise negative ROS  Physical Exam: BP 128/74    Pulse 71    Temp (!) 96.7 F (35.9 C) (Temporal)    Resp 20    Ht 5\' 4"  (1.626 m)    Wt 108.9 kg    SpO2 96%    BMI 41.20 kg/m  General:  Alert,  pleasant and cooperative in NAD Head:  Normocephalic and atraumatic. Neck:  Supple; no masses or thyromegaly. Lungs:  Clear throughout to auscultation, normal respiratory effort.    Heart:  +S1, +S2, Regular rate and rhythm, No edema. Abdomen:  Soft, nontender and nondistended. Normal bowel sounds, without guarding, and without rebound.   Neurologic:  Alert and  oriented x4;  grossly normal neurologically.  Impression/Plan: Brooke Schultz is here for an colonoscopy to be performed for Screening colonoscopy average risk   Risks, benefits, limitations, and alternatives regarding  colonoscopy have been reviewed with the patient.  Questions have been answered.  All parties agreeable.   Jonathon Bellows, MD  02/18/2021, 7:43 AM

## 2021-02-19 ENCOUNTER — Encounter: Payer: Self-pay | Admitting: Gastroenterology

## 2021-02-19 LAB — SURGICAL PATHOLOGY

## 2021-02-21 ENCOUNTER — Encounter: Payer: Self-pay | Admitting: Gastroenterology

## 2021-03-23 ENCOUNTER — Encounter: Payer: Self-pay | Admitting: Internal Medicine

## 2021-03-23 ENCOUNTER — Ambulatory Visit (INDEPENDENT_AMBULATORY_CARE_PROVIDER_SITE_OTHER): Payer: 59 | Admitting: Internal Medicine

## 2021-03-23 VITALS — BP 136/84 | HR 94 | Temp 97.7°F | Resp 16 | Ht 64.0 in | Wt 239.4 lb

## 2021-03-23 DIAGNOSIS — I10 Essential (primary) hypertension: Secondary | ICD-10-CM | POA: Diagnosis not present

## 2021-03-23 DIAGNOSIS — E782 Mixed hyperlipidemia: Secondary | ICD-10-CM

## 2021-03-23 DIAGNOSIS — F5101 Primary insomnia: Secondary | ICD-10-CM | POA: Diagnosis not present

## 2021-03-23 DIAGNOSIS — Z6841 Body Mass Index (BMI) 40.0 and over, adult: Secondary | ICD-10-CM | POA: Diagnosis not present

## 2021-03-23 MED ORDER — ZOLPIDEM TARTRATE 10 MG PO TABS: 10.0000 mg | ORAL_TABLET | Freq: Every evening | ORAL | 0 refills | Status: DC | PRN

## 2021-03-23 NOTE — Assessment & Plan Note (Signed)
Ambien 5 mg did not work for the patient - reviewed sleep study results from April but patient refuses to wear CPAP. Discussed how stress and anxiety is playing a role in insomnia but patient does not want to pursue therapy. Discussed risks of being on Ambien on a nightly basis and how risks will increase as she gets older. Patient understands these risks but states it is the only thing that will help her sleep. Discussed sleep hygiene, decreasing screen time and weight loss. Will refill Ambien 10 mg but must work on weight loss and lifestyle changes. Will recheck at follow up.  ?

## 2021-03-23 NOTE — Assessment & Plan Note (Signed)
Reviewed lipid panel results with the patient - has intolerance to statins but on Fenofibrate. Discussed starting something like Repatha but ASCVD risk 5.3%. Will work on losing weight and dietary changes.  ?

## 2021-03-23 NOTE — Patient Instructions (Addendum)
It was great seeing you today! ? ?Plan discussed at today's visit: ?-Try to reduce screen time, move TV out of bedroom ?-Work on diet and weight loss, switching to lean proteins and less sugar and carbs.  ? ?Follow up in: 3 months  ? ?Take care and let us know if you have any questions or concerns prior to your next visit. ? ?Dr. Rosana Berger ? ? ? ?Please call to schedule your mammogram at [phone]. ?

## 2021-03-23 NOTE — Assessment & Plan Note (Signed)
Discussed at length - will work on lifestyle modification and exercise.   ?

## 2021-03-23 NOTE — Progress Notes (Signed)
? ?Established Patient Office Visit ? ?Subjective:  ?Patient ID: Brooke Schultz, female    DOB: 1960/04/07  Age: 61 y.o. MRN: [id number] ? ?CC:  ?Chief Complaint  ?Patient presents with  ? Follow-up  ? Hypertension  ? Insomnia  ? ? ?HPI ?Brooke Schultz presents for follow up on chronic medical conditions.  ? ?Hypertension: ?-Medications: Lisinopril 20, Bystotlic 20, Amlodipine 2.5 ?-Patient is compliant with above medications and reports no side effects. ?-Checking BP at home (average): doesn't really check - BP 120/80 when getting colonoscopy  ?-Does get headaches with high blood pressure, but not recently. Denies any SOB, CP, vision changes, LE edema or symptoms of hypotension ?-Diet: drinks a lot  diet coke, trying to switch to leaner proteins  ?  ?HLD: ?-Medications: Fenofibrate 145. Anaphylactic reaction to statins  ?-Patient is compliant with above medications and reports no side effects.  ?-Last lipid panel: Lipid Panel  ?   ?Component Value Date/Time  ? CHOL 225 (H) 12/21/2020 1038  ? TRIG 239 (H) 12/21/2020 1038  ? HDL 45 (L) 12/21/2020 1038  ? CHOLHDL 5.0 (H) 12/21/2020 1038  ? LDLCALC 141 (H) 12/21/2020 1038  ? ?The 10-year ASCVD risk score (Arnett DK, et al., 2019) is: 5.3% ?  Values used to calculate the score: ?    Age: 74 years ?    Sex: Female ?    Is Non-Hispanic African American: No ?    Diabetic: No ?    Tobacco smoker: No ?    Systolic Blood Pressure: 443 mmHg ?    Is BP treated: Yes ?    HDL Cholesterol: 45 mg/dL ?    Total Cholesterol: 225 mg/dL ?  ?Insomnia: ?-Currently on Ambien 10 mg, used to take every night x 25 years - decreased to 5 mg at LOV but this was not working for her ?-Had a recent sleep study in 4/22 which recommends repeat sleep study, patient refuses to wear CPAP ?-Decreases sugar and caffeine before bed but watches tv and uses phone in bed ?  ?Health Maintenance: ?-Blood work: UTD ?-Mammogram ordered but not yet obtained, will call to schedule  ?-Colon cancer screening:  Cologuard positive then had colonoscopy, was told to repeat in 3 years. Ordered at [facility], colonoscopy 1/23, repeat in another 3 years ? ?Past Medical History:  ?Diagnosis Date  ? Hyperlipidemia   ? Hypertension   ? Insomnia   ? ? ?Past Surgical History:  ?Procedure Laterality Date  ? ABDOMINAL HYSTERECTOMY    ? COLONOSCOPY    ? COLONOSCOPY WITH PROPOFOL N/A 02/18/2021  ? Procedure: COLONOSCOPY WITH PROPOFOL;  Surgeon: Jonathon Bellows, MD;  Location: [facility] ENDOSCOPY;  Service: Gastroenterology;  Laterality: N/A;  ? EYE SURGERY    ? GALLBLADDER SURGERY    ? OVARIAN CYST REMOVAL    ? TUBAL LIGATION    ? ? ?Family History  ?Problem Relation Age of Onset  ? Diabetes Mother   ? Hypertension Mother   ? Stroke Brother   ? COPD Maternal Grandmother   ? ? ?Social History  ? ?Socioeconomic History  ? Marital status: Married  ?  Spouse name: Not on file  ? Number of children: Not on file  ? Years of education: Not on file  ? Highest education level: Not on file  ?Occupational History  ? Not on file  ?Tobacco Use  ? Smoking status: Former  ?  Types: Cigarettes  ? Smokeless tobacco: Never  ? Tobacco comments:  ?  been smoke free  for 7weeks  ?Vaping Use  ? Vaping Use: Former  ? Devices: JUUL  ?Substance and Sexual Activity  ? Alcohol use: Yes  ?  Comment: ocassionally,none last 24hrs  ? Drug use: No  ? Sexual activity: Yes  ?Other Topics Concern  ? Not on file  ?Social History Narrative  ? Not on file  ? ?Social Determinants of Health  ? ?Financial Resource Strain: Not on file  ?Food Insecurity: Not on file  ?Transportation Needs: Not on file  ?Physical Activity: Not on file  ?Stress: Not on file  ?Social Connections: Not on file  ?Intimate Partner Violence: Not on file  ? ? ?Outpatient Medications Prior to Visit  ?Medication Sig Dispense Refill  ? Acetaminophen (TYLENOL EXTRA STRENGTH PO) Take 2 tablets by mouth as needed.    ? amLODipine (NORVASC) 2.5 MG tablet Take 1 tablet (2.5 mg total) by mouth daily. 90 tablet 3  ? fenofibrate  (TRICOR) 145 MG tablet Take 1 tablet (145 mg total) by mouth daily. 90 tablet 3  ? fluticasone (FLONASE) 50 MCG/ACT nasal spray Place 2 sprays into both nostrils daily. 16 g 6  ? lisinopril (ZESTRIL) 20 MG tablet Take 1 tablet (20 mg total) by mouth daily. 90 tablet 3  ? Nebivolol HCl (BYSTOLIC) 20 MG TABS Take 1 tab po daily 90 tablet 1  ? zolpidem (AMBIEN) 5 MG tablet Take 1 tablet (5 mg total) by mouth at bedtime as needed for sleep. 90 tablet 0  ? ?No facility-administered medications prior to visit.  ? ? ?Allergies  ?Allergen Reactions  ? Statins Anaphylaxis  ? Bactrim [Sulfamethoxazole-Trimethoprim]   ? ? ?ROS ?Review of Systems  ?Constitutional:  Negative for chills and fever.  ?HENT:  Positive for congestion.   ?Eyes:  Negative for visual disturbance.  ?Respiratory:  Positive for cough.   ?Cardiovascular:  Negative for chest pain.  ?Gastrointestinal:  Negative for abdominal pain.  ?Psychiatric/Behavioral:  Positive for sleep disturbance.   ? ?  ?Objective:  ?  ?Physical Exam ?Constitutional:   ?   Appearance: Normal appearance. She is obese.  ?HENT:  ?   Head: Normocephalic and atraumatic.  ?Eyes:  ?   Conjunctiva/sclera: Conjunctivae normal.  ?Cardiovascular:  ?   Rate and Rhythm: Normal rate and regular rhythm.  ?Pulmonary:  ?   Effort: Pulmonary effort is normal.  ?   Breath sounds: Normal breath sounds.  ?Skin: ?   General: Skin is warm and dry.  ?Neurological:  ?   General: No focal deficit present.  ?   Mental Status: She is alert. Mental status is at baseline.  ?Psychiatric:     ?   Mood and Affect: Mood normal.     ?   Behavior: Behavior normal.  ? ? ?BP 136/84   Pulse 94   Temp 97.7 ?F (36.5 ?C)   Resp 16   Ht 5' 4"  (1.626 m)   Wt 239 lb 6.4 oz (108.6 kg)   SpO2 98%   BMI 41.09 kg/m?  ?Wt Readings from Last 3 Encounters:  ?02/18/21 240 lb (108.9 kg)  ?12/21/20 242 lb 9.6 oz (110 kg)  ?07/09/20 241 lb (109.3 kg)  ? ? ? ?Health Maintenance Due  ?Topic Date Due  ? HIV Screening  Never done  ?  Hepatitis C Screening  Never done  ? Zoster Vaccines- Shingrix (1 of 2) Never done  ? MAMMOGRAM  05/14/2018  ? COVID-19 Vaccine (3 - Booster for Pfizer series) 02/02/2020  ? ? ?There  are no preventive care reminders to display for this patient. ? ?No results found for: TSH ?Lab Results  ?Component Value Date  ? WBC 4.1 12/21/2020  ? HGB 12.6 12/21/2020  ? HCT 37.9 12/21/2020  ? MCV 88.1 12/21/2020  ? PLT 395 12/21/2020  ? ?Lab Results  ?Component Value Date  ? NA 140 12/21/2020  ? K 4.0 12/21/2020  ? CO2 27 12/21/2020  ? GLUCOSE 135 (H) 12/21/2020  ? BUN 14 12/21/2020  ? CREATININE 0.69 12/21/2020  ? BILITOT 0.4 12/21/2020  ? ALKPHOS 98 08/22/2016  ? AST 19 12/21/2020  ? ALT 19 12/21/2020  ? PROT 7.0 12/21/2020  ? ALBUMIN 3.7 08/22/2016  ? CALCIUM 9.1 12/21/2020  ? ANIONGAP 11 08/22/2016  ? EGFR 101 12/21/2020  ? ?Lab Results  ?Component Value Date  ? CHOL 225 (H) 12/21/2020  ? ?Lab Results  ?Component Value Date  ? HDL 45 (L) 12/21/2020  ? ?Lab Results  ?Component Value Date  ? LDLCALC 141 (H) 12/21/2020  ? ?Lab Results  ?Component Value Date  ? TRIG 239 (H) 12/21/2020  ? ?Lab Results  ?Component Value Date  ? CHOLHDL 5.0 (H) 12/21/2020  ? ?No results found for: HGBA1C ? ?  ?Assessment & Plan:  ? ?Problem List Items Addressed This Visit   ? ?  ? Cardiovascular and Mediastinum  ? Essential hypertension  ?  Stable, continue current medications. ? ?  ?  ?  ? Other  ? Mixed hyperlipidemia  ?  Reviewed lipid panel results with the patient - has intolerance to statins but on Fenofibrate. Discussed starting something like Repatha but ASCVD risk 5.3%. Will work on losing weight and dietary changes.  ?  ?  ? Primary insomnia - Primary  ?  Ambien 5 mg did not work for the patient - reviewed sleep study results from April but patient refuses to wear CPAP. Discussed how stress and anxiety is playing a role in insomnia but patient does not want to pursue therapy. Discussed risks of being on Ambien on a nightly basis and how  risks will increase as she gets older. Patient understands these risks but states it is the only thing that will help her sleep. Discussed sleep hygiene, decreasing screen time and weight loss. Will refill Ambien 10 mg bu

## 2021-03-23 NOTE — Assessment & Plan Note (Signed)
Stable, continue current medications.  

## 2021-06-09 LAB — HM MAMMOGRAPHY

## 2021-06-21 ENCOUNTER — Other Ambulatory Visit: Payer: Self-pay | Admitting: Internal Medicine

## 2021-06-21 DIAGNOSIS — I10 Essential (primary) hypertension: Secondary | ICD-10-CM

## 2021-06-21 NOTE — Telephone Encounter (Signed)
Requested Prescriptions  Pending Prescriptions Disp Refills  . Nebivolol HCl 20 MG TABS [Pharmacy Med Name: NEBIVOLOL TABS '20MG'$ ] 90 tablet 0    Sig: TAKE 1 TABLET DAILY     Cardiovascular: Beta Blockers 3 Passed - 06/21/2021 12:59 AM      Passed - Cr in normal range and within 360 days    Creat  Date Value Ref Range Status  12/21/2020 0.69 0.50 - 1.03 mg/dL Final         Passed - AST in normal range and within 360 days    AST  Date Value Ref Range Status  12/21/2020 19 10 - 35 U/L Final         Passed - ALT in normal range and within 360 days    ALT  Date Value Ref Range Status  12/21/2020 19 6 - 29 U/L Final         Passed - Last BP in normal range    BP Readings from Last 1 Encounters:  03/23/21 136/84         Passed - Last Heart Rate in normal range    Pulse Readings from Last 1 Encounters:  03/23/21 94         Passed - Valid encounter within last 6 months    Recent Outpatient Visits          3 months ago Primary insomnia   [place], DO   6 months ago Encounter for screening mammogram for malignant neoplasm of breast   [facility] Teodora Medici, DO      Future Appointments            In 1 week Teodora Medici, [facility], [facility]

## 2021-06-23 ENCOUNTER — Ambulatory Visit: Payer: 59 | Admitting: Internal Medicine

## 2021-06-28 ENCOUNTER — Encounter: Payer: Self-pay | Admitting: Internal Medicine

## 2021-06-28 ENCOUNTER — Ambulatory Visit (INDEPENDENT_AMBULATORY_CARE_PROVIDER_SITE_OTHER): Payer: 59 | Admitting: Internal Medicine

## 2021-06-28 VITALS — BP 124/84 | HR 91 | Temp 98.2°F | Resp 16 | Ht 64.0 in | Wt 243.6 lb

## 2021-06-28 DIAGNOSIS — E782 Mixed hyperlipidemia: Secondary | ICD-10-CM

## 2021-06-28 DIAGNOSIS — Z6841 Body Mass Index (BMI) 40.0 and over, adult: Secondary | ICD-10-CM | POA: Diagnosis not present

## 2021-06-28 DIAGNOSIS — F5101 Primary insomnia: Secondary | ICD-10-CM

## 2021-06-28 DIAGNOSIS — I10 Essential (primary) hypertension: Secondary | ICD-10-CM

## 2021-06-28 MED ORDER — ZOLPIDEM TARTRATE 10 MG PO TABS: 10.0000 mg | ORAL_TABLET | Freq: Every evening | ORAL | 0 refills | Status: DC | PRN

## 2021-07-07 ENCOUNTER — Ambulatory Visit: Payer: Self-pay | Admitting: *Deleted

## 2021-07-07 NOTE — Telephone Encounter (Signed)
Called pt and tried to get her to schedule an appt. Pt refused. She just wanted to know if there is anything otc or a regiment that she can do for covid. Please advise.

## 2021-07-07 NOTE — Telephone Encounter (Signed)
Summary: covid / medication   Pt tested positive for covid yesterday / symptoms started Monday with a sore throat / pt also has a cough, body aches, headache and sneezing / pt asked if there was any medication she could get or take / please advise      Chief Complaint: Covid positive Symptoms: Severe body aches, headache. Has dry cough, sneezing,had sore throat, not presently. Frequency: Symptoms onset Monday night, tested positive Tuesday Pertinent Negatives: Patient denies fever, SOB Disposition: [] ED /[] Urgent Care (no appt availability in office) / [] Appointment(In office/virtual)/ []  [place] [facility]/ [] Home Care/ [] Refused Recommended Disposition /[] [facility]/ [x]  Follow-up with PCP Additional Notes: Pt questioning if PCP recommends one of the oral anti-virals. Aware will need appt. If so. Home care advise provided, decliined review of self isolation process, "I've had it before." Please advise. Reason for Disposition  [1] COVID-19 diagnosed by positive lab test (e.g., PCR, rapid self-test kit) AND [2] mild symptoms (e.g., cough, fever, others) AND [0] no complications or SOB  Answer Assessment - Initial Assessment Questions 1. COVID-19 DIAGNOSIS: "Who made your COVID-19 diagnosis?" "Was it confirmed by a positive lab test or self-test?" If not diagnosed by a doctor (or NP/PA), ask "Are there lots of cases (community spread) where you live?" Note: See public health department website, if unsure.     Home test, Tuesday 2. COVID-19 EXPOSURE: "Was there any known exposure to COVID before the symptoms began?" CDC Definition of close contact: within 6 feet (2 meters) for a total of 15 minutes or more over a 24-hour period.      no 3. ONSET: "When did the COVID-19 symptoms start?"      Monday night 4. WORST SYMPTOM: "What is your worst symptom?" (e.g., cough, fever, shortness of breath, muscle aches)     Aches and headache 5. COUGH: "Do you have a cough?" If Yes, ask:  "How bad is the cough?"       Dry cough 6. FEVER: "Do you have a fever?" If Yes, ask: "What is your temperature, how was it measured, and when did it start?"     Unsure, not warm 7. RESPIRATORY STATUS: "Describe your breathing?" (e.g., shortness of breath, wheezing, unable to speak)      WNL  98% 97% 8. BETTER-SAME-WORSE: "Are you getting better, staying the same or getting worse compared to yesterday?"  If getting worse, ask, "In what way?"     Worse with aches and headahe 9. HIGH RISK DISEASE: "Do you have any chronic medical problems?" (e.g., asthma, heart or lung disease, weak immune system, obesity, etc.)     *No Answer* 10. VACCINE: "Have you had the COVID-19 vaccine?" If Yes, ask: "Which one, how many shots, when did you get it?"       2 11. BOOSTER: "Have you received your COVID-19 booster?" If Yes, ask: "Which one and when did you get it?"    1 in Dec. 2021 13. OTHER SYMPTOMS: "Do you have any other symptoms?"  (e.g., chills, fatigue, headache, loss of smell or taste, muscle pain, sore throat)       Body aches,headache,,sneezing 14. O2 SATURATION MONITOR:  "Do you use an oxygen saturation monitor (pulse oximeter) at home?" If Yes, ask "What is your reading (oxygen level) today?" "What is your usual oxygen saturation reading?" (e.g., 95%)       97-98%  Protocols used: Coronavirus (COVID-19) Diagnosed or Suspected-A-AH

## 2021-07-08 ENCOUNTER — Telehealth (INDEPENDENT_AMBULATORY_CARE_PROVIDER_SITE_OTHER): Payer: 59 | Admitting: Internal Medicine

## 2021-07-08 ENCOUNTER — Ambulatory Visit: Payer: 59 | Admitting: Internal Medicine

## 2021-07-08 ENCOUNTER — Encounter: Payer: Self-pay | Admitting: Internal Medicine

## 2021-07-08 DIAGNOSIS — U071 COVID-19: Secondary | ICD-10-CM | POA: Diagnosis not present

## 2021-07-08 DIAGNOSIS — R051 Acute cough: Secondary | ICD-10-CM | POA: Diagnosis not present

## 2021-07-08 MED ORDER — BENZONATATE 100 MG PO CAPS: 100.0000 mg | ORAL_CAPSULE | Freq: Two times a day (BID) | ORAL | 0 refills | Status: DC | PRN

## 2021-07-08 MED ORDER — NIRMATRELVIR/RITONAVIR (PAXLOVID)TABLET: 3.0000 | ORAL_TABLET | Freq: Two times a day (BID) | ORAL | 0 refills | Status: AC

## 2021-07-08 NOTE — Progress Notes (Signed)
Virtual Visit via Video Note  I connected with Brooke Schultz on 07/08/21 at  9:20 AM EDT by a video enabled telemedicine application and verified that I am speaking with the correct person using two identifiers.  Location: Patient: Home Provider: [facility]   I discussed the limitations of evaluation and management by telemedicine and the availability of in person appointments. The patient expressed understanding and agreed to proceed.  History of Present Illness:  Brooke Schultz is a 61 year old female presenting via telemedicine after having a home positive COVID test. Monday started having a sore throat at work, took a test and was positive 07/06/21.   URI Compliant:  -Worst symptom: cough keeping her up at night  -Fever: no -Cough: yes -Shortness of breath: no -Wheezing: yes -Chest congestion: no -Nasal congestion: yes -Runny nose: yes -Sore throat: yes -Context: stable -Relief with OTC cold/cough medications: no  -Treatments attempted: Tylenol, Advil,  cold/sinus and cough syrup   Observations/Objective: Home pulse ox 97-99%  General: well appearing, no acute distress ENT: conjunctiva normal appearing bilaterally  Pulm: Occasional dry cough Neuro: Answers all questions appropriately  Assessment and Plan:  1. COVID-19/Acute cough: Symptoms began on 07/05/2021, home positive test on 07/06/2021.  She currently works from home and is overall doing well with the exception of persistent dry cough keeping her up at night.  Tessalon Perles will be sent to her pharmacy for cough, as well as Paxlovid.  Continue to monitor oxygenation levels at home, rest and stay well-hydrated.  Follow-up as needed if symptoms worsen or fail to improve.  - nirmatrelvir/ritonavir EUA (PAXLOVID) 20 x 150 MG & 10 x '100MG'$  TABS; Take 3 tablets by mouth 2 (two) times daily for 5 days. (Take nirmatrelvir 150 mg two tablets twice daily for 5 days and ritonavir 100 mg one tablet twice daily for 5 days) Patient GFR is  101.  Dispense: 30 tablet; Refill: 0 - benzonatate (TESSALON) 100 MG capsule; Take 1 capsule (100 mg total) by mouth 2 (two) times daily as needed for cough.  Dispense: 20 capsule; Refill: 0   Follow Up Instructions: As needed if symptoms worsen or fail to improve    I discussed the assessment and treatment plan with the patient. The patient was provided an opportunity to ask questions and all were answered. The patient agreed with the plan and demonstrated an understanding of the instructions.   The patient was advised to call back or seek an in-person evaluation if the symptoms worsen or if the condition fails to improve as anticipated.  I provided 11 minutes of non-face-to-face time during this encounter.   Teodora Medici, DO

## 2021-09-17 ENCOUNTER — Other Ambulatory Visit: Payer: Self-pay | Admitting: Internal Medicine

## 2021-09-17 DIAGNOSIS — I10 Essential (primary) hypertension: Secondary | ICD-10-CM

## 2021-09-17 NOTE — Telephone Encounter (Signed)
Requested Prescriptions  Pending Prescriptions Disp Refills  . Nebivolol HCl 20 MG TABS [Pharmacy Med Name: NEBIVOLOL TABS '20MG'$ ] 90 tablet 1    Sig: TAKE 1 TABLET DAILY     Cardiovascular: Beta Blockers 3 Passed - 09/17/2021 12:49 AM      Passed - Cr in normal range and within 360 days    Creat  Date Value Ref Range Status  12/21/2020 0.69 0.50 - 1.03 mg/dL Final         Passed - AST in normal range and within 360 days    AST  Date Value Ref Range Status  12/21/2020 19 10 - 35 U/L Final         Passed - ALT in normal range and within 360 days    ALT  Date Value Ref Range Status  12/21/2020 19 6 - 29 U/L Final         Passed - Last BP in normal range    BP Readings from Last 1 Encounters:  06/28/21 124/84         Passed - Last Heart Rate in normal range    Pulse Readings from Last 1 Encounters:  06/28/21 91         Passed - Valid encounter within last 6 months    Recent Outpatient Visits          2 months ago [place], DO   2 months ago Primary insomnia   [place], DO   5 months ago Primary insomnia   [facility] Teodora Medici, DO   9 months ago Encounter for screening mammogram for malignant neoplasm of breast   [facility] Teodora Medici, DO      Future Appointments            In 1 week Teodora Medici, [facility], [facility]

## 2021-09-24 ENCOUNTER — Ambulatory Visit (INDEPENDENT_AMBULATORY_CARE_PROVIDER_SITE_OTHER): Payer: 59 | Admitting: Internal Medicine

## 2021-09-24 ENCOUNTER — Encounter: Payer: Self-pay | Admitting: Internal Medicine

## 2021-09-24 VITALS — BP 122/86 | HR 83 | Temp 98.3°F | Resp 16 | Ht 64.0 in | Wt 244.6 lb

## 2021-09-24 DIAGNOSIS — Z23 Encounter for immunization: Secondary | ICD-10-CM | POA: Diagnosis not present

## 2021-09-24 DIAGNOSIS — E782 Mixed hyperlipidemia: Secondary | ICD-10-CM | POA: Diagnosis not present

## 2021-09-24 DIAGNOSIS — I10 Essential (primary) hypertension: Secondary | ICD-10-CM | POA: Diagnosis not present

## 2021-09-24 DIAGNOSIS — F5101 Primary insomnia: Secondary | ICD-10-CM

## 2021-09-24 MED ORDER — LISINOPRIL 20 MG PO TABS: 20.0000 mg | ORAL_TABLET | Freq: Every day | ORAL | 1 refills | Status: DC

## 2021-09-24 MED ORDER — ZOLPIDEM TARTRATE 10 MG PO TABS: 10.0000 mg | ORAL_TABLET | Freq: Every evening | ORAL | 0 refills | Status: DC | PRN

## 2021-09-24 MED ORDER — FENOFIBRATE 145 MG PO TABS: 145.0000 mg | ORAL_TABLET | Freq: Every day | ORAL | 1 refills | Status: DC

## 2021-09-24 NOTE — Progress Notes (Signed)
Established Patient Office Visit  Subjective:  Patient ID: Brooke Schultz, female    DOB: 1960-10-12  Age: 61 y.o. MRN: [id number]  CC:  Chief Complaint  Patient presents with   Follow-up   Hypertension    HPI Brooke Schultz presents for follow up on chronic medical conditions.   Hypertension: -Medications: Lisinopril 20 mg, Bystotlic 20 mg, Amlodipine 2.5 mg -Patient is compliant with above medications and reports no side effects. -Checking BP at home (average): doesn't really check  -Does get headaches with high blood pressure, but not recently. Denies any SOB, CP, vision changes, LE edema or symptoms of hypotension -Diet: drinks a lot  diet coke, trying to switch to leaner proteins - wants to try to lose weight but likes to snack on sugary things    HLD: -Medications: Fenofibrate 145 mg. 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  -Last lipid panel: Lipid Panel     Component Value Date/Time   CHOL 225 (H) 12/21/2020 1038   TRIG 239 (H) 12/21/2020 1038   HDL 45 (L) 12/21/2020 1038   CHOLHDL 5.0 (H) 12/21/2020 1038   LDLCALC 141 (H) 12/21/2020 1038   The 10-year ASCVD risk score (Arnett DK, et al., 2019) is: 4.3%   Values used to calculate the score:     Age: 89 years     Sex: Female     Is Non-Hispanic African American: No     Diabetic: No     Tobacco smoker: No     Systolic Blood Pressure: 748 mmHg     Is BP treated: Yes     HDL Cholesterol: 45 mg/dL     Total Cholesterol: 225 mg/dL   Insomnia: -Currently on Ambien 10 mg, used to take every night x 25 years - decreased to 5 mg late last year but this was not working for her -Had a recent sleep study in 4/22 which recommends repeat sleep study, patient refuses to wear CPAP -Decreases sugar and caffeine before bed but watches tv and uses phone in bed Did have [place] in July, which made insomnia worse.  Patient was treated with Paxlovid, however she  had a presumed allergic reaction to it was unable to finish it.  Symptoms resolved at this point.   Health Maintenance: -Blood work: UTD -due at follow-up -Mammogram ordered but not yet obtained, will call to schedule  -Colon cancer screening: Colonoscopy 1/23, repeat in another 3 years  Past Medical History:  Diagnosis Date   Hyperlipidemia    Hypertension    Insomnia     Past Surgical History:  Procedure Laterality Date   ABDOMINAL HYSTERECTOMY     COLONOSCOPY     COLONOSCOPY WITH PROPOFOL N/A 02/18/2021   Procedure: COLONOSCOPY WITH PROPOFOL;  Surgeon: Jonathon Bellows, MD;  Location: [facility] ENDOSCOPY;  Service: Gastroenterology;  Laterality: N/A;   EYE SURGERY     GALLBLADDER SURGERY     OVARIAN CYST REMOVAL     TUBAL LIGATION      Family History  Problem Relation Age of Onset   Diabetes Mother    Hypertension Mother    Stroke Brother    COPD Maternal Grandmother     Social History   Socioeconomic History   Marital status: Married    Spouse name: Not on file   Number of children: Not on file   Years of education: Not on file   Highest education level: Not on file  Occupational History  Not on file  Tobacco Use   Smoking status: Former    Types: Cigarettes   Smokeless tobacco: Never   Tobacco comments:    been smoke free for 7weeks  Vaping Use   Vaping Use: Former   Devices: JUUL  Substance and Sexual Activity   Alcohol use: Yes    Comment: ocassionally,none last 24hrs   Drug use: No   Sexual activity: Yes  Other Topics Concern   Not on file  Social History Narrative   Not on file   Social Determinants of Health   Financial Resource Strain: Not on file  Food Insecurity: Not on file  Transportation Needs: Not on file  Physical Activity: Not on file  Stress: Not on file  Social Connections: Not on file  Intimate Partner Violence: Not on file    Outpatient Medications Prior to Visit  Medication Sig Dispense Refill   Acetaminophen (TYLENOL EXTRA  STRENGTH PO) Take 2 tablets by mouth as needed.     amLODipine (NORVASC) 2.5 MG tablet Take 1 tablet (2.5 mg total) by mouth daily. 90 tablet 3   benzonatate (TESSALON) 100 MG capsule Take 1 capsule (100 mg total) by mouth 2 (two) times daily as needed for cough. 20 capsule 0   fenofibrate (TRICOR) 145 MG tablet Take 1 tablet (145 mg total) by mouth daily. 90 tablet 3   fluticasone (FLONASE) 50 MCG/ACT nasal spray Place 2 sprays into both nostrils daily. 16 g 6   lisinopril (ZESTRIL) 20 MG tablet Take 1 tablet (20 mg total) by mouth daily. 90 tablet 3   Nebivolol HCl 20 MG TABS TAKE 1 TABLET DAILY 90 tablet 1   zolpidem (AMBIEN) 10 MG tablet Take 1 tablet (10 mg total) by mouth at bedtime as needed for sleep. 90 tablet 0   No facility-administered medications prior to visit.    Allergies  Allergen Reactions   Statins Anaphylaxis   Bactrim [Sulfamethoxazole-Trimethoprim]     ROS Review of Systems  Constitutional:  Negative for chills and fever.  Eyes:  Negative for visual disturbance.  Respiratory:  Negative for shortness of breath.   Cardiovascular:  Negative for chest pain.  Gastrointestinal:  Negative for abdominal pain.  Psychiatric/Behavioral:  Negative for sleep disturbance.       Objective:    Physical Exam Constitutional:      Appearance: Normal appearance. She is obese.  HENT:     Head: Normocephalic and atraumatic.  Eyes:     Conjunctiva/sclera: Conjunctivae normal.  Cardiovascular:     Rate and Rhythm: Normal rate and regular rhythm.  Pulmonary:     Effort: Pulmonary effort is normal.     Breath sounds: Normal breath sounds.  Skin:    General: Skin is warm and dry.  Neurological:     General: No focal deficit present.     Mental Status: She is alert. Mental status is at baseline.  Psychiatric:        Mood and Affect: Mood normal.        Behavior: Behavior normal.     BP 122/86   Pulse 83   Temp 98.3 F (36.8 C)   Resp 16   Ht 5' 4"  (1.626 m)   Wt  244 lb 9.6 oz (110.9 kg)   SpO2 98%   BMI 41.99 kg/m  Wt Readings from Last 3 Encounters:  09/24/21 244 lb 9.6 oz (110.9 kg)  06/28/21 243 lb 9.6 oz (110.5 kg)  03/23/21 239 lb 6.4 oz (108.6 kg)  Health Maintenance Due  Topic Date Due   HIV Screening  Never done   Hepatitis C Screening  Never done   Zoster Vaccines- Shingrix (1 of 2) Never done   MAMMOGRAM  05/14/2018   COVID-19 Vaccine (3 - Pfizer series) 02/02/2020    There are no preventive care reminders to display for this patient.  No results found for: "TSH" Lab Results  Component Value Date   WBC 4.1 12/21/2020   HGB 12.6 12/21/2020   HCT 37.9 12/21/2020   MCV 88.1 12/21/2020   PLT 395 12/21/2020   Lab Results  Component Value Date   NA 140 12/21/2020   K 4.0 12/21/2020   CO2 27 12/21/2020   GLUCOSE 135 (H) 12/21/2020   BUN 14 12/21/2020   CREATININE 0.69 12/21/2020   BILITOT 0.4 12/21/2020   ALKPHOS 98 08/22/2016   AST 19 12/21/2020   ALT 19 12/21/2020   PROT 7.0 12/21/2020   ALBUMIN 3.7 08/22/2016   CALCIUM 9.1 12/21/2020   ANIONGAP 11 08/22/2016   EGFR 101 12/21/2020   Lab Results  Component Value Date   CHOL 225 (H) 12/21/2020   Lab Results  Component Value Date   HDL 45 (L) 12/21/2020   Lab Results  Component Value Date   LDLCALC 141 (H) 12/21/2020   Lab Results  Component Value Date   TRIG 239 (H) 12/21/2020   Lab Results  Component Value Date   CHOLHDL 5.0 (H) 12/21/2020   No results found for: "HGBA1C"    Assessment & Plan:   1. Essential hypertension: Chronic and stable.  Blood pressure at goal today.  Continue amlodipine 2.5 mg, lisinopril 20 mg and labetalol 20 mg daily.  Lisinopril refilled today.  Follow-up in 3 months for recheck.  - lisinopril (ZESTRIL) 20 MG tablet; Take 1 tablet (20 mg total) by mouth daily.  Dispense: 90 tablet; Refill: 1  2. Mixed hyperlipidemia: Due for refills today, Tricor 150 mg daily refilled.  Plan to recheck fasting lipid panel at  follow-up  - fenofibrate (TRICOR) 145 MG tablet; Take 1 tablet (145 mg total) by mouth daily.  Dispense: 90 tablet; Refill: 1  3. Primary insomnia: Stable.  Refill Ambien 10 mg daily.  - zolpidem (AMBIEN) 10 MG tablet; Take 1 tablet (10 mg total) by mouth at bedtime as needed for sleep.  Dispense: 90 tablet; Refill: 0  4. Need for influenza vaccination: Flu vaccine administered today.  - Flu Vaccine QUAD 6+ mos PF IM (Fluarix Quad PF)   Follow-up: Return in about 3 months (around 12/24/2021).    Teodora Medici, DO

## 2021-09-24 NOTE — Patient Instructions (Signed)
It was great seeing you today!  Plan discussed at today's visit: -Medications refilled -Plan for fasting labs next time, please fast 8-12 hours  -Flu vaccine today   Follow up in: 3 months   Take care and let us know if you have any questions or concerns prior to your next visit.  Dr. Rosana Berger

## 2021-09-28 ENCOUNTER — Ambulatory Visit: Payer: 59 | Admitting: Internal Medicine

## 2021-12-22 NOTE — Progress Notes (Signed)
Established Patient Office Visit  Subjective:  Patient ID: Brooke Schultz, female    DOB: June 04, 1960  Age: 61 y.o. MRN: [id number]  CC:  Chief Complaint  Patient presents with   Follow-up   Hypertension   Hyperlipidemia    HPI Brooke Schultz presents for follow up on chronic medical conditions.   Hypertension: -Medications: Lisinopril 20 mg, Bystotlic 20 mg, Amlodipine 2.5 mg -Patient is compliant with above medications and reports no side effects. -Checking BP at home (average): Does not check -Denies any SOB, CP, vision changes, LE edema or symptoms of hypotension.  Has been having slight headaches recently, however she is trying to decrease her nicotine use.  She quit smoking cigarettes about 5 years ago but is vaping.  She recently went down from 5% nicotine to 3%, which is causing some mild headaches. -Diet: drinks a lot  diet coke, trying to switch to leaner proteins - wants to try to lose weight but likes to snack on sugary things    HLD: -Medications: Fenofibrate 145 mg. 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  -Last lipid panel: Lipid Panel     Component Value Date/Time   CHOL 225 (H) 12/21/2020 1038   TRIG 239 (H) 12/21/2020 1038   HDL 45 (L) 12/21/2020 1038   CHOLHDL 5.0 (H) 12/21/2020 1038   LDLCALC 141 (H) 12/21/2020 1038   The 10-year ASCVD risk score (Arnett DK, et al., 2019) is: 5.7%   Values used to calculate the score:     Age: 12 years     Sex: Female     Is Non-Hispanic African American: No     Diabetic: No     Tobacco smoker: No     Systolic Blood Pressure: 136 mmHg     Is BP treated: Yes     HDL Cholesterol: 45 mg/dL     Total Cholesterol: 225 mg/dL   Insomnia: -Currently on Ambien 10 mg, used to take every night x 25 years - decreased to 5 mg late last year but this was not working for her -Had a recent sleep study in 4/22 which recommends repeat sleep study, patient refuses  to wear CPAP -Decreases sugar and caffeine before bed but watches tv and uses phone in bed Did have COVID in July, which made insomnia worse.  Patient was treated with Paxlovid, however she had a presumed allergic reaction to it was unable to finish it.  Symptoms resolved at this point.   Health Maintenance: -Blood work: Due -Mammogram 06/09/21 -Colon cancer screening: Colonoscopy 1/23, repeat in another 3 years  Past Medical History:  Diagnosis Date   Hyperlipidemia    Hypertension    Insomnia     Past Surgical History:  Procedure Laterality Date   ABDOMINAL HYSTERECTOMY     COLONOSCOPY     COLONOSCOPY WITH PROPOFOL N/A 02/18/2021   Procedure: COLONOSCOPY WITH PROPOFOL;  Surgeon: Wyline Mood, MD;  Location: [facility] ENDOSCOPY;  Service: Gastroenterology;  Laterality: N/A;   EYE SURGERY     GALLBLADDER SURGERY     OVARIAN CYST REMOVAL     TUBAL LIGATION      Family History  Problem Relation Age of Onset   Diabetes Mother    Hypertension Mother    Stroke Brother    COPD Maternal Grandmother     Social History   Socioeconomic History   Marital status: Married    Spouse name: Not on file   Number  of children: Not on file   Years of education: Not on file   Highest education level: Not on file  Occupational History   Not on file  Tobacco Use   Smoking status: Former    Types: Cigarettes   Smokeless tobacco: Never   Tobacco comments:    been smoke free for 7weeks  Vaping Use   Vaping Use: Former   Devices: JUUL  Substance and Sexual Activity   Alcohol use: Yes    Comment: ocassionally,none last 24hrs   Drug use: No   Sexual activity: Yes  Other Topics Concern   Not on file  Social History Narrative   Not on file   Social Determinants of Health   Financial Resource Strain: Not on file  Food Insecurity: Not on file  Transportation Needs: Not on file  Physical Activity: Not on file  Stress: Not on file  Social Connections: Not on file  Intimate Partner  Violence: Not on file    Outpatient Medications Prior to Visit  Medication Sig Dispense Refill   Acetaminophen (TYLENOL EXTRA STRENGTH PO) Take 2 tablets by mouth as needed.     amLODipine (NORVASC) 2.5 MG tablet Take 1 tablet (2.5 mg total) by mouth daily. 90 tablet 3   benzonatate (TESSALON) 100 MG capsule Take 1 capsule (100 mg total) by mouth 2 (two) times daily as needed for cough. 20 capsule 0   fenofibrate (TRICOR) 145 MG tablet Take 1 tablet (145 mg total) by mouth daily. 90 tablet 1   fluticasone (FLONASE) 50 MCG/ACT nasal spray Place 2 sprays into both nostrils daily. 16 g 6   lisinopril (ZESTRIL) 20 MG tablet Take 1 tablet (20 mg total) by mouth daily. 90 tablet 1   Nebivolol HCl 20 MG TABS TAKE 1 TABLET DAILY 90 tablet 1   zolpidem (AMBIEN) 10 MG tablet Take 1 tablet (10 mg total) by mouth at bedtime as needed for sleep. 90 tablet 0   No facility-administered medications prior to visit.    Allergies  Allergen Reactions   Statins Anaphylaxis   Bactrim [Sulfamethoxazole-Trimethoprim]    Paxlovid [Nirmatrelvir-Ritonavir] Swelling    ROS Review of Systems  Constitutional:  Negative for chills and fever.  Eyes:  Negative for visual disturbance.  Respiratory:  Negative for shortness of breath.   Cardiovascular:  Negative for chest pain.  Gastrointestinal:  Negative for abdominal pain.  Psychiatric/Behavioral:  Negative for sleep disturbance.       Objective:    Physical Exam Constitutional:      Appearance: Normal appearance. She is obese.  HENT:     Head: Normocephalic and atraumatic.  Eyes:     Conjunctiva/sclera: Conjunctivae normal.  Cardiovascular:     Rate and Rhythm: Normal rate and regular rhythm.  Pulmonary:     Effort: Pulmonary effort is normal.     Breath sounds: Normal breath sounds.  Skin:    General: Skin is warm and dry.  Neurological:     General: No focal deficit present.     Mental Status: She is alert. Mental status is at baseline.   Psychiatric:        Mood and Affect: Mood normal.        Behavior: Behavior normal.     BP 136/84   Pulse 87   Temp 98.5 F (36.9 C) (Oral)   Resp 16   Wt 247 lb 9.6 oz (112.3 kg)   SpO2 97%   BMI 42.50 kg/m  Wt Readings from Last 3  Encounters:  12/23/21 247 lb 9.6 oz (112.3 kg)  09/24/21 244 lb 9.6 oz (110.9 kg)  06/28/21 243 lb 9.6 oz (110.5 kg)     Health Maintenance Due  Topic Date Due   HIV Screening  Never done   Hepatitis C Screening  Never done   Zoster Vaccines- Shingrix (1 of 2) Never done   MAMMOGRAM  05/14/2018   COVID-19 Vaccine (4 - 2023-24 season) 09/03/2021    There are no preventive care reminders to display for this patient.  No results found for: "TSH" Lab Results  Component Value Date   WBC 4.1 12/21/2020   HGB 12.6 12/21/2020   HCT 37.9 12/21/2020   MCV 88.1 12/21/2020   PLT 395 12/21/2020   Lab Results  Component Value Date   NA 140 12/21/2020   K 4.0 12/21/2020   CO2 27 12/21/2020   GLUCOSE 135 (H) 12/21/2020   BUN 14 12/21/2020   CREATININE 0.69 12/21/2020   BILITOT 0.4 12/21/2020   ALKPHOS 98 08/22/2016   AST 19 12/21/2020   ALT 19 12/21/2020   PROT 7.0 12/21/2020   ALBUMIN 3.7 08/22/2016   CALCIUM 9.1 12/21/2020   ANIONGAP 11 08/22/2016   EGFR 101 12/21/2020   Lab Results  Component Value Date   CHOL 225 (H) 12/21/2020   Lab Results  Component Value Date   HDL 45 (L) 12/21/2020   Lab Results  Component Value Date   LDLCALC 141 (H) 12/21/2020   Lab Results  Component Value Date   TRIG 239 (H) 12/21/2020   Lab Results  Component Value Date   CHOLHDL 5.0 (H) 12/21/2020   No results found for: "HGBA1C"    Assessment & Plan:   1. Essential hypertension: Chronic, blood pressure borderline today.  Due for annual labs including CMP and CBC.  Continue amlodipine 2.5 mg, lisinopril 20 mg and labetalol 20 mg, all refilled today.  - COMPLETE METABOLIC PANEL WITH GFR - CBC w/Diff/Platelet - amLODipine (NORVASC)  2.5 MG tablet; Take 1 tablet (2.5 mg total) by mouth daily.  Dispense: 90 tablet; Refill: 3 - lisinopril (ZESTRIL) 20 MG tablet; Take 1 tablet (20 mg total) by mouth daily.  Dispense: 90 tablet; Refill: 1 - Nebivolol HCl 20 MG TABS; Take 1 tablet (20 mg total) by mouth daily.  Dispense: 90 tablet; Refill: 1  2. Mixed hyperlipidemia: Due for fasting labs today.  Continue fenofibrate 145 mg daily, refilled.  - Lipid Profile - fenofibrate (TRICOR) 145 MG tablet; Take 1 tablet (145 mg total) by mouth daily.  Dispense: 90 tablet; Refill: 1  3. Primary insomnia: Stable.  Ambien 10 mg refilled.  Follow-up in 3 months.  - zolpidem (AMBIEN) 10 MG tablet; Take 1 tablet (10 mg total) by mouth at bedtime as needed for sleep.  Dispense: 90 tablet; Refill: 0  4. Need for hepatitis C screening test/Encounter for screening for HIV: Screening labs due.  - Hepatitis C Antibody - HIV antibody (with reflex)   Follow-up: Return in about 3 months (around 03/24/2022).    Margarita Mail, DO

## 2021-12-23 ENCOUNTER — Encounter: Payer: Self-pay | Admitting: Internal Medicine

## 2021-12-23 ENCOUNTER — Ambulatory Visit (INDEPENDENT_AMBULATORY_CARE_PROVIDER_SITE_OTHER): Payer: 59 | Admitting: Internal Medicine

## 2021-12-23 VITALS — BP 136/84 | HR 87 | Temp 98.5°F | Resp 16 | Ht 64.0 in | Wt 247.6 lb

## 2021-12-23 DIAGNOSIS — F5101 Primary insomnia: Secondary | ICD-10-CM | POA: Diagnosis not present

## 2021-12-23 DIAGNOSIS — E782 Mixed hyperlipidemia: Secondary | ICD-10-CM | POA: Diagnosis not present

## 2021-12-23 DIAGNOSIS — I10 Essential (primary) hypertension: Secondary | ICD-10-CM

## 2021-12-23 DIAGNOSIS — R7309 Other abnormal glucose: Secondary | ICD-10-CM

## 2021-12-23 DIAGNOSIS — Z1159 Encounter for screening for other viral diseases: Secondary | ICD-10-CM | POA: Diagnosis not present

## 2021-12-23 DIAGNOSIS — Z114 Encounter for screening for human immunodeficiency virus [HIV]: Secondary | ICD-10-CM

## 2021-12-23 MED ORDER — LISINOPRIL 20 MG PO TABS: 20.0000 mg | ORAL_TABLET | Freq: Every day | ORAL | 1 refills | Status: DC

## 2021-12-23 MED ORDER — ZOLPIDEM TARTRATE 10 MG PO TABS: 10.0000 mg | ORAL_TABLET | Freq: Every evening | ORAL | 0 refills | Status: DC | PRN

## 2021-12-23 MED ORDER — AMLODIPINE BESYLATE 2.5 MG PO TABS: 2.5000 mg | ORAL_TABLET | Freq: Every day | ORAL | 3 refills | Status: DC

## 2021-12-23 MED ORDER — FENOFIBRATE 145 MG PO TABS: 145.0000 mg | ORAL_TABLET | Freq: Every day | ORAL | 1 refills | Status: DC

## 2021-12-23 MED ORDER — NEBIVOLOL HCL 20 MG PO TABS: 1.0000 | ORAL_TABLET | Freq: Every day | ORAL | 1 refills | Status: DC

## 2021-12-23 NOTE — Patient Instructions (Signed)
It was great seeing you today!  Plan discussed at today's visit: -Blood work ordered today, results will be uploaded to [facility].  -Medications refilled. -Consider shingles vaccine. -Recommend nicotine patch 7 mg/24 hour  Follow up in: 3 months   Take care and let us know if you have any questions or concerns prior to your next visit.  Dr. Rosana Berger

## 2021-12-24 NOTE — Addendum Note (Signed)
Addended by: Teodora Medici on: 12/24/2021 09:19 AM   Modules accepted: Orders

## 2021-12-25 LAB — LIPID PANEL
Cholesterol: 227 mg/dL — ABNORMAL HIGH (ref <200)
HDL: 46 mg/dL — ABNORMAL LOW (ref >=50)
LDL Cholesterol (Calc): 142 mg/dL (calc) — ABNORMAL HIGH
Non-HDL Cholesterol (Calc): 181 mg/dL (calc) — ABNORMAL HIGH (ref <130)
Total CHOL/HDL Ratio: 4.9 (calc) (ref <5.0)
Triglycerides: 240 mg/dL — ABNORMAL HIGH (ref <150)

## 2021-12-25 LAB — CBC WITH DIFFERENTIAL/PLATELET
Absolute Monocytes: 409 cells/uL (ref 200–950)
Basophils Absolute: 32 cells/uL (ref 0–200)
Basophils Relative: 0.7 %
Eosinophils Absolute: 129 cells/uL (ref 15–500)
Eosinophils Relative: 2.8 %
HCT: 38.8 % (ref 35.0–45.0)
Hemoglobin: 13 g/dL (ref 11.7–15.5)
Lymphs Abs: 2006 cells/uL (ref 850–3900)
MCH: 29.5 pg (ref 27.0–33.0)
MCHC: 33.5 g/dL (ref 32.0–36.0)
MCV: 88 fL (ref 80.0–100.0)
MPV: 9.6 fL (ref 7.5–12.5)
Monocytes Relative: 8.9 %
Neutro Abs: 2024 cells/uL (ref 1500–7800)
Neutrophils Relative %: 44 %
Platelets: 349 10*3/uL (ref 140–400)
RBC: 4.41 10*6/uL (ref 3.80–5.10)
RDW: 12.5 % (ref 11.0–15.0)
Total Lymphocyte: 43.6 %
WBC: 4.6 10*3/uL (ref 3.8–10.8)

## 2021-12-25 LAB — HIV ANTIBODY (ROUTINE TESTING W REFLEX): HIV 1&2 Ab, 4th Generation: NONREACTIVE

## 2021-12-25 LAB — COMPLETE METABOLIC PANEL WITH GFR
AG Ratio: 1.4 (calc) (ref 1.0–2.5)
ALT: 13 U/L (ref 6–29)
AST: 13 U/L (ref 10–35)
Albumin: 4 g/dL (ref 3.6–5.1)
Alkaline phosphatase (APISO): 53 U/L (ref 37–153)
BUN: 16 mg/dL (ref 7–25)
CO2: 29 mmol/L (ref 20–32)
Calcium: 9.2 mg/dL (ref 8.6–10.4)
Chloride: 102 mmol/L (ref 98–110)
Creat: 0.76 mg/dL (ref 0.50–1.03)
Globulin: 2.9 g/dL (calc) (ref 1.9–3.7)
Glucose, Bld: 164 mg/dL — ABNORMAL HIGH (ref 65–99)
Potassium: 4.1 mmol/L (ref 3.5–5.3)
Sodium: 141 mmol/L (ref 135–146)
Total Bilirubin: 0.5 mg/dL (ref 0.2–1.2)
Total Protein: 6.9 g/dL (ref 6.1–8.1)
eGFR: 90 mL/min/{1.73_m2} (ref >=60)

## 2021-12-25 LAB — TEST AUTHORIZATION

## 2021-12-25 LAB — HEMOGLOBIN A1C
Hgb A1c MFr Bld: 8.1 % of total Hgb — ABNORMAL HIGH (ref <5.7)
Mean Plasma Glucose: 186 mg/dL
eAG (mmol/L): 10.3 mmol/L

## 2021-12-25 LAB — HEPATITIS C ANTIBODY: Hepatitis C Ab: NONREACTIVE

## 2021-12-29 ENCOUNTER — Encounter: Payer: Self-pay | Admitting: Ophthalmology

## 2022-01-10 NOTE — Discharge Instructions (Signed)

## 2022-01-11 ENCOUNTER — Ambulatory Visit: Payer: 59 | Admitting: Anesthesiology

## 2022-01-11 ENCOUNTER — Encounter: Payer: Self-pay | Admitting: Ophthalmology

## 2022-01-11 ENCOUNTER — Encounter
Admission: RE | Disposition: A | Discharge status: Home / Self Care | Payer: Self-pay | Source: Ambulatory Visit | Attending: Ophthalmology

## 2022-01-11 ENCOUNTER — Other Ambulatory Visit: Payer: Self-pay

## 2022-01-11 ENCOUNTER — Ambulatory Visit
Admission: RE | Admit: 2022-01-11 | Discharge: 2022-01-11 | Disposition: A | Discharge status: Home / Self Care | Payer: 59 | Source: Ambulatory Visit | Attending: Ophthalmology | Admitting: Ophthalmology

## 2022-01-11 DIAGNOSIS — H2511 Age-related nuclear cataract, right eye: Secondary | ICD-10-CM | POA: Diagnosis present

## 2022-01-11 DIAGNOSIS — Z6841 Body Mass Index (BMI) 40.0 and over, adult: Secondary | ICD-10-CM | POA: Insufficient documentation

## 2022-01-11 DIAGNOSIS — I10 Essential (primary) hypertension: Secondary | ICD-10-CM | POA: Insufficient documentation

## 2022-01-11 DIAGNOSIS — F419 Anxiety disorder, unspecified: Secondary | ICD-10-CM | POA: Diagnosis not present

## 2022-01-11 DIAGNOSIS — E1136 Type 2 diabetes mellitus with diabetic cataract: Secondary | ICD-10-CM | POA: Insufficient documentation

## 2022-01-11 HISTORY — PX: CATARACT EXTRACTION W/PHACO: SHX586

## 2022-01-11 SURGERY — PHACOEMULSIFICATION, CATARACT, WITH IOL INSERTION
Anesthesia: Monitor Anesthesia Care | Site: Eye | Laterality: Right

## 2022-01-11 MED ORDER — MOXIFLOXACIN HCL 0.5 % OP SOLN
OPHTHALMIC | Status: DC | PRN
  Administered 2022-01-11: .2 mL via OPHTHALMIC

## 2022-01-11 MED ORDER — TETRACAINE HCL 0.5 % OP SOLN
1.0000 [drp] | OPHTHALMIC | Status: DC | PRN
  Administered 2022-01-11 (×2): 1 [drp] via OPHTHALMIC

## 2022-01-11 MED ORDER — SIGHTPATH DOSE#1 BSS IO SOLN
INTRAOCULAR | Status: DC | PRN
  Administered 2022-01-11: 15 mL via INTRAOCULAR

## 2022-01-11 MED ORDER — SIGHTPATH DOSE#1 BSS IO SOLN
INTRAOCULAR | Status: DC | PRN
  Administered 2022-01-11: 53 mL via OPHTHALMIC

## 2022-01-11 MED ORDER — FENTANYL CITRATE PF 50 MCG/ML IJ SOSY: 25.0000 ug | PREFILLED_SYRINGE | INTRAMUSCULAR | Status: DC | PRN

## 2022-01-11 MED ORDER — SIGHTPATH DOSE#1 BSS IO SOLN
INTRAOCULAR | Status: DC | PRN
  Administered 2022-01-11: 2 mL

## 2022-01-11 MED ORDER — BRIMONIDINE TARTRATE-TIMOLOL 0.2-0.5 % OP SOLN
OPHTHALMIC | Status: DC | PRN
  Administered 2022-01-11: 1 [drp] via OPHTHALMIC

## 2022-01-11 MED ORDER — LACTATED RINGERS IV SOLN: INTRAVENOUS | Status: DC

## 2022-01-11 MED ORDER — MIDAZOLAM HCL 2 MG/2ML IJ SOLN
INTRAMUSCULAR | Status: DC | PRN
  Administered 2022-01-11: 1 mg via INTRAVENOUS
  Administered 2022-01-11: .5 mg via INTRAVENOUS

## 2022-01-11 MED ORDER — ARMC OPHTHALMIC DILATING DROPS
1.0000 | OPHTHALMIC | Status: DC | PRN
  Administered 2022-01-11 (×3): 1 via OPHTHALMIC

## 2022-01-11 MED ORDER — SIGHTPATH DOSE#1 NA CHONDROIT SULF-NA HYALURON 40-17 MG/ML IO SOLN
INTRAOCULAR | Status: DC | PRN
  Administered 2022-01-11: 1 mL via INTRAOCULAR

## 2022-01-11 MED ORDER — ONDANSETRON HCL 4 MG/2ML IJ SOLN: 4.0000 mg | Freq: Once | INTRAMUSCULAR | Status: DC | PRN

## 2022-01-11 MED ORDER — FENTANYL CITRATE (PF) 100 MCG/2ML IJ SOLN
INTRAMUSCULAR | Status: DC | PRN
  Administered 2022-01-11: 50 ug via INTRAVENOUS
  Administered 2022-01-11: 25 ug via INTRAVENOUS

## 2022-01-11 SURGICAL SUPPLY — 11 items
CANNULA ANT/CHMB 27G (MISCELLANEOUS) IMPLANT
CANNULA ANT/CHMB 27GA (MISCELLANEOUS) IMPLANT
CATARACT SUITE SIGHTPATH (MISCELLANEOUS) ×1 IMPLANT
FEE CATARACT SUITE SIGHTPATH (MISCELLANEOUS) ×1 IMPLANT
GLOVE SURG ENC TEXT LTX SZ8 (GLOVE) ×1 IMPLANT
GLOVE SURG TRIUMPH 8.0 PF LTX (GLOVE) ×1 IMPLANT
LENS IOL TECNIS EYHANCE 28.0 (Intraocular Lens) IMPLANT
NDL FILTER BLUNT 18X1 1/2 (NEEDLE) ×1 IMPLANT
NEEDLE FILTER BLUNT 18X1 1/2 (NEEDLE) ×1 IMPLANT
SYR 3ML LL SCALE MARK (SYRINGE) ×1 IMPLANT
WATER STERILE IRR 250ML POUR (IV SOLUTION) ×1 IMPLANT

## 2022-01-11 NOTE — Anesthesia Postprocedure Evaluation (Signed)
Anesthesia Post Note  Patient: Brooke Schultz  Procedure(s) Performed: CATARACT EXTRACTION PHACO AND INTRAOCULAR LENS PLACEMENT (IOC) RIGHT 4.91 00:33.8 (Right: Eye)  Patient location during evaluation: PACU Anesthesia Type: MAC Level of consciousness: awake and alert Pain management: pain level controlled Vital Signs Assessment: post-procedure vital signs reviewed and stable Respiratory status: spontaneous breathing, nonlabored ventilation, respiratory function stable and patient connected to nasal cannula oxygen Cardiovascular status: stable and blood pressure returned to baseline Postop Assessment: no apparent nausea or vomiting Anesthetic complications: no   No notable events documented.   Last Vitals:  Vitals:   01/11/22 0909 01/11/22 0913  BP: (!) 109/92 121/73  Pulse: (!) 51 (!) 57  Resp: 10 11  Temp: 36.5 C 36.5 C  SpO2: 100% 97%    Last Pain:  Vitals:   01/11/22 0913  TempSrc:   PainSc: 0-No pain                 Molli Barrows

## 2022-01-11 NOTE — Anesthesia Preprocedure Evaluation (Signed)
Anesthesia Evaluation  Patient identified by MRN, date of birth, ID band Patient awake    Reviewed: Allergy & Precautions, H&P , NPO status , Patient's Chart, lab work & pertinent test results, reviewed documented beta blocker date and time   Airway Mallampati: II  TM Distance: >3 FB Neck ROM: full    Dental no notable dental hx. (+) Teeth Intact   Pulmonary neg pulmonary ROS, former smoker   Pulmonary exam normal breath sounds clear to auscultation       Cardiovascular Exercise Tolerance: Good hypertension, On Medications negative cardio ROS  Rhythm:regular Rate:Normal     Neuro/Psych  PSYCHIATRIC DISORDERS Anxiety     negative neurological ROS     GI/Hepatic negative GI ROS, Neg liver ROS,,,  Endo/Other  diabetes, Well Controlled  Morbid obesity  Renal/GU      Musculoskeletal   Abdominal   Peds  Hematology negative hematology ROS (+)   Anesthesia Other Findings   Reproductive/Obstetrics negative OB ROS                             Anesthesia Physical Anesthesia Plan  ASA: 3  Anesthesia Plan: MAC   Post-op Pain Management:    Induction:   PONV Risk Score and Plan: 2  Airway Management Planned:   Additional Equipment:   Intra-op Plan:   Post-operative Plan:   Informed Consent: I have reviewed the patients History and Physical, chart, labs and discussed the procedure including the risks, benefits and alternatives for the proposed anesthesia with the patient or authorized representative who has indicated his/her understanding and acceptance.       Plan Discussed with: CRNA  Anesthesia Plan Comments:        Anesthesia Quick Evaluation

## 2022-01-11 NOTE — Transfer of Care (Signed)
Immediate Anesthesia Transfer of Care Note  Patient: Brooke Schultz  Procedure(s) Performed: CATARACT EXTRACTION PHACO AND INTRAOCULAR LENS PLACEMENT (IOC) RIGHT 4.91 00:33.8 (Right: Eye)  Patient Location: PACU  Anesthesia Type:MAC  Level of Consciousness: awake, alert , and oriented  Airway & Oxygen Therapy: Patient Spontanous Breathing  Post-op Assessment: Report given to RN  Post vital signs: Reviewed and stable  Last Vitals:  Vitals Value Taken Time  BP 121/73 01/11/22 0913  Temp 36.5 C 01/11/22 0913  Pulse 54 01/11/22 0914  Resp 15 01/11/22 0914  SpO2 97 % 01/11/22 0914  Vitals shown include unvalidated device data.  Last Pain:  Vitals:   01/11/22 0913  TempSrc:   PainSc: 0-No pain         Complications: No notable events documented.

## 2022-01-11 NOTE — H&P (Signed)
[facility]   Primary Care Physician:  Teodora Medici, DO Ophthalmologist: Dr. George Ina  Pre-Procedure History & Physical: HPI:  Brooke Schultz is a 62 y.o. female here for cataract surgery.   Past Medical History:  Diagnosis Date   Hyperlipidemia    Hypertension    Insomnia     Past Surgical History:  Procedure Laterality Date   ABDOMINAL HYSTERECTOMY     COLONOSCOPY     COLONOSCOPY WITH PROPOFOL N/A 02/18/2021   Procedure: COLONOSCOPY WITH PROPOFOL;  Surgeon: Jonathon Bellows, MD;  Location: [facility] ENDOSCOPY;  Service: Gastroenterology;  Laterality: N/A;   EYE SURGERY     GALLBLADDER SURGERY     OVARIAN CYST REMOVAL     TUBAL LIGATION      Prior to Admission medications   Medication Sig Start Date End Date Taking? Authorizing Provider  Acetaminophen (TYLENOL EXTRA STRENGTH PO) Take 2 tablets by mouth as needed.   Yes [provider]  amLODipine (NORVASC) 2.5 MG tablet Take 1 tablet (2.5 mg total) by mouth daily. 12/23/21  Yes Teodora Medici, DO  fenofibrate (TRICOR) 145 MG tablet Take 1 tablet (145 mg total) by mouth daily. 12/23/21  Yes Teodora Medici, DO  lisinopril (ZESTRIL) 20 MG tablet Take 1 tablet (20 mg total) by mouth daily. 12/23/21  Yes Teodora Medici, DO  Nebivolol HCl 20 MG TABS Take 1 tablet (20 mg total) by mouth daily. 12/23/21  Yes Teodora Medici, DO  zolpidem (AMBIEN) 10 MG tablet Take 1 tablet (10 mg total) by mouth at bedtime as needed for sleep. 12/23/21 03/23/22 Yes Teodora Medici, DO    Allergies as of 11/29/2021 - Review Complete 09/24/2021  Allergen Reaction Noted   Statins Anaphylaxis 08/22/2016   Bactrim [sulfamethoxazole-trimethoprim]  12/07/2016   Paxlovid [nirmatrelvir-ritonavir] Swelling 09/24/2021    Family History  Problem Relation Age of Onset   Diabetes Mother    Hypertension Mother    Stroke Brother    COPD Maternal Grandmother     Social History   Socioeconomic History   Marital status:  Married    Spouse name: Not on file   Number of children: Not on file   Years of education: Not on file   Highest education level: Not on file  Occupational History   Not on file  Tobacco Use   Smoking status: Former    Types: Cigarettes   Smokeless tobacco: Never   Tobacco comments:    been smoke free for 7weeks  Vaping Use   Vaping Use: Former   Devices: JUUL  Substance and Sexual Activity   Alcohol use: Yes    Comment: ocassionally,none last 24hrs   Drug use: No   Sexual activity: Yes  Other Topics Concern   Not on file  Social History Narrative   Not on file   Social Determinants of Health   Financial Resource Strain: Not on file  Food Insecurity: Not on file  Transportation Needs: Not on file  Physical Activity: Not on file  Stress: Not on file  Social Connections: Not on file  Intimate Partner Violence: Not on file    Review of Systems: See HPI, otherwise negative ROS  Physical Exam: BP (!) 157/91   Pulse 71   Temp 98.1 F (36.7 C) (Temporal)   Resp 18   Ht '5\' 4"'$  (1.626 m)   Wt 108.4 kg   SpO2 98%   BMI 41.02 kg/m  General:   Alert, cooperative in NAD Head:  Normocephalic and atraumatic. Respiratory:  Normal  work of breathing. Cardiovascular:  RRR  Impression/Plan: Brooke Schultz is here for cataract surgery.  Risks, benefits, limitations, and alternatives regarding cataract surgery have been reviewed with the patient.  Questions have been answered.  All parties agreeable.   Birder Robson, MD  01/11/2022, 8:43 AM

## 2022-01-11 NOTE — Op Note (Signed)
PREOPERATIVE DIAGNOSIS:  Nuclear sclerotic cataract of the right eye.   POSTOPERATIVE DIAGNOSIS:  Cataract   OPERATIVE PROCEDURE:ORPROCALL@   SURGEON:  Birder Robson, MD.   ANESTHESIA:  Anesthesiologist: Molli Barrows, MD CRNA: Gigi Gin, CRNA  1.      Managed anesthesia care. 2.      0.68m of Shugarcaine was instilled in the eye following the paracentesis.   COMPLICATIONS:  None.   TECHNIQUE:   Stop and chop   DESCRIPTION OF PROCEDURE:  The patient was examined and consented in the preoperative holding area where the aforementioned topical anesthesia was applied to the right eye and then brought back to the Operating Room where the right eye was prepped and draped in the usual sterile ophthalmic fashion and a lid speculum was placed. A paracentesis was created with the side port blade and the anterior chamber was filled with viscoelastic. A near clear corneal incision was performed with the steel keratome. A continuous curvilinear capsulorrhexis was performed with a cystotome followed by the capsulorrhexis forceps. Hydrodissection and hydrodelineation were carried out with BSS on a blunt cannula. The lens was removed in a stop and chop  technique and the remaining cortical material was removed with the irrigation-aspiration handpiece. The capsular bag was inflated with viscoelastic and the Technis [id number]  lens was placed in the capsular bag without complication. The remaining viscoelastic was removed from the eye with the irrigation-aspiration handpiece. The wounds were hydrated. The anterior chamber was flushed with BSS and the eye was inflated to physiologic pressure. 0.167mof Vigamox was placed in the anterior chamber. The wounds were found to be water tight. The eye was dressed with Combigan. The patient was given protective glasses to wear throughout the day and a shield with which to sleep tonight. The patient was also given drops with which to begin a drop regimen today and will  follow-up with me in one day. Implant Name Type Inv. Item Serial No. Manufacturer Lot No. LRB No. Used Action  LENS IOL TECNIS EYHANCE 28.0 - [id number] Lens LENS IOL TECNIS EYHANCE 28.0 [id number]  Right 1 Implanted   Procedure(s): CATARACT EXTRACTION PHACO AND INTRAOCULAR LENS PLACEMENT (IOC) RIGHT 4.91 00:33.8 (Right)  Electronically signed: WiBirder Robson/09/2022 9:08 AM

## 2022-01-12 ENCOUNTER — Encounter: Payer: Self-pay | Admitting: Ophthalmology

## 2022-01-12 NOTE — Progress Notes (Unsigned)
Established Patient Office Visit  Subjective:  Patient ID: Brooke Schultz, female    DOB: 1960-02-09  Age: 62 y.o. MRN: [id number]  CC:  No chief complaint on file.   HPI Brooke Schultz presents to discuss new diagnosis of type 2 diabetes.   Diabetes, Type 2: -Last A1c 12/23 8.1% -Medications: *** -Patient is compliant with the above medications and reports no side effects. *** -Checking BG at home: *** -Fasting home BG: *** -Post-prandial home BG: *** -Highest home BG since last visit: *** -Lowest home BG since last visit: *** -Diet: *** -Exercise: *** -Eye exam: *** -Foot exam: *** -Microalbumin: *** -Statin: *** -PNA vaccine: *** -Denies symptoms of hypoglycemia, polyuria, polydipsia, numbness extremities, foot ulcers/trauma. ***   Hypertension: -Medications: Lisinopril 20 mg, Bystotlic 20 mg, Amlodipine 2.5 mg -Patient is compliant with above medications and reports no side effects. -Checking BP at home (average): Does not check -Denies any SOB, CP, vision changes, LE edema or symptoms of hypotension.  Has been having slight headaches recently, however she is trying to decrease her nicotine use.  She quit smoking cigarettes about 5 years ago but is vaping.  She recently went down from 5% nicotine to 3%, which is causing some mild headaches. -Diet: drinks a lot  diet coke, trying to switch to leaner proteins - wants to try to lose weight but likes to snack on sugary things    HLD: -Medications: Fenofibrate 145 mg. 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  -Last lipid panel: Lipid Panel     Component Value Date/Time   CHOL 227 (H) 12/23/2021 0822   TRIG 240 (H) 12/23/2021 0822   HDL 46 (L) 12/23/2021 0822   CHOLHDL 4.9 12/23/2021 0822   LDLCALC 142 (H) 12/23/2021 0822   The 10-year ASCVD risk score (Arnett DK, et al., 2019) is: 4.5%   Values used to calculate the score:     Age: 49 years      Sex: Female     Is Non-Hispanic African American: No     Diabetic: No     Tobacco smoker: No     Systolic Blood Pressure: 474 mmHg     Is BP treated: Yes     HDL Cholesterol: 46 mg/dL     Total Cholesterol: 227 mg/dL   Insomnia: -Currently on Ambien 10 mg, used to take every night x 25 years - decreased to 5 mg late last year but this was not working for her -Had a recent sleep study in 4/22 which recommends repeat sleep study, patient refuses to wear CPAP -Decreases sugar and caffeine before bed but watches tv and uses phone in bed Did have Brooke Schultz in July, which made insomnia worse.  Patient was treated with Paxlovid, however she had a presumed allergic reaction to it was unable to finish it.  Symptoms resolved at this point.   Health Maintenance: -Blood work: Due -Mammogram 06/09/21 -Colon cancer screening: Colonoscopy 1/23, repeat in another 3 years  Past Medical History:  Diagnosis Date   Hyperlipidemia    Hypertension    Insomnia     Past Surgical History:  Procedure Laterality Date   ABDOMINAL HYSTERECTOMY     CATARACT EXTRACTION W/PHACO Right 01/11/2022   Procedure: CATARACT EXTRACTION PHACO AND INTRAOCULAR LENS PLACEMENT (IOC) RIGHT 4.91 00:33.8;  Surgeon: Birder Robson, MD;  Location: [facility];  Service: Ophthalmology;  Laterality: Right;   COLONOSCOPY     COLONOSCOPY WITH PROPOFOL  N/A 02/18/2021   Procedure: COLONOSCOPY WITH PROPOFOL;  Surgeon: Jonathon Bellows, MD;  Location: [facility] ENDOSCOPY;  Service: Gastroenterology;  Laterality: N/A;   EYE SURGERY     GALLBLADDER SURGERY     OVARIAN CYST REMOVAL     TUBAL LIGATION      Family History  Problem Relation Age of Onset   Diabetes Mother    Hypertension Mother    Stroke Brother    COPD Maternal Grandmother     Social History   Socioeconomic History   Marital status: Married    Spouse name: Not on file   Number of children: Not on file   Years of education: Not on file   Highest education level: Not  on file  Occupational History   Not on file  Tobacco Use   Smoking status: Former    Types: Cigarettes   Smokeless tobacco: Never   Tobacco comments:    been smoke free for 7weeks  Vaping Use   Vaping Use: Former   Devices: JUUL  Substance and Sexual Activity   Alcohol use: Yes    Comment: ocassionally,none last 24hrs   Drug use: No   Sexual activity: Yes  Other Topics Concern   Not on file  Social History Narrative   Not on file   Social Determinants of Health   Financial Resource Strain: Not on file  Food Insecurity: Not on file  Transportation Needs: Not on file  Physical Activity: Not on file  Stress: Not on file  Social Connections: Not on file  Intimate Partner Violence: Not on file    Outpatient Medications Prior to Visit  Medication Sig Dispense Refill   Acetaminophen (TYLENOL EXTRA STRENGTH PO) Take 2 tablets by mouth as needed.     amLODipine (NORVASC) 2.5 MG tablet Take 1 tablet (2.5 mg total) by mouth daily. 90 tablet 3   fenofibrate (TRICOR) 145 MG tablet Take 1 tablet (145 mg total) by mouth daily. 90 tablet 1   lisinopril (ZESTRIL) 20 MG tablet Take 1 tablet (20 mg total) by mouth daily. 90 tablet 1   Nebivolol HCl 20 MG TABS Take 1 tablet (20 mg total) by mouth daily. 90 tablet 1   zolpidem (AMBIEN) 10 MG tablet Take 1 tablet (10 mg total) by mouth at bedtime as needed for sleep. 90 tablet 0   No facility-administered medications prior to visit.    Allergies  Allergen Reactions   Statins Anaphylaxis   Bactrim [Sulfamethoxazole-Trimethoprim]    Paxlovid [Nirmatrelvir-Ritonavir] Swelling    ROS Review of Systems  Constitutional:  Negative for chills and fever.  Eyes:  Negative for visual disturbance.  Respiratory:  Negative for shortness of breath.   Cardiovascular:  Negative for chest pain.  Gastrointestinal:  Negative for abdominal pain.  Psychiatric/Behavioral:  Negative for sleep disturbance.       Objective:    Physical  Exam Constitutional:      Appearance: Normal appearance. She is obese.  HENT:     Head: Normocephalic and atraumatic.  Eyes:     Conjunctiva/sclera: Conjunctivae normal.  Cardiovascular:     Rate and Rhythm: Normal rate and regular rhythm.  Pulmonary:     Effort: Pulmonary effort is normal.     Breath sounds: Normal breath sounds.  Skin:    General: Skin is warm and dry.  Neurological:     General: No focal deficit present.     Mental Status: She is alert. Mental status is at baseline.  Psychiatric:  Mood and Affect: Mood normal.        Behavior: Behavior normal.     There were no vitals taken for this visit. Wt Readings from Last 3 Encounters:  01/11/22 239 lb (108.4 kg)  12/23/21 247 lb 9.6 oz (112.3 kg)  09/24/21 244 lb 9.6 oz (110.9 kg)     Health Maintenance Due  Topic Date Due   COVID-19 Vaccine (4 - 2023-24 season) 09/03/2021    There are no preventive care reminders to display for this patient.  No results found for: "TSH" Lab Results  Component Value Date   WBC 4.6 12/23/2021   HGB 13.0 12/23/2021   HCT 38.8 12/23/2021   MCV 88.0 12/23/2021   PLT 349 12/23/2021   Lab Results  Component Value Date   NA 141 12/23/2021   K 4.1 12/23/2021   CO2 29 12/23/2021   GLUCOSE 164 (H) 12/23/2021   BUN 16 12/23/2021   CREATININE 0.76 12/23/2021   BILITOT 0.5 12/23/2021   ALKPHOS 98 08/22/2016   AST 13 12/23/2021   ALT 13 12/23/2021   PROT 6.9 12/23/2021   ALBUMIN 3.7 08/22/2016   CALCIUM 9.2 12/23/2021   ANIONGAP 11 08/22/2016   EGFR 90 12/23/2021   Lab Results  Component Value Date   CHOL 227 (H) 12/23/2021   Lab Results  Component Value Date   HDL 46 (L) 12/23/2021   Lab Results  Component Value Date   LDLCALC 142 (H) 12/23/2021   Lab Results  Component Value Date   TRIG 240 (H) 12/23/2021   Lab Results  Component Value Date   CHOLHDL 4.9 12/23/2021   Lab Results  Component Value Date   HGBA1C 8.1 (H) 12/23/2021       Assessment & Plan:   1. Essential hypertension: Chronic, blood pressure borderline today.  Due for annual labs including CMP and CBC.  Continue amlodipine 2.5 mg, lisinopril 20 mg and labetalol 20 mg, all refilled today.  - COMPLETE METABOLIC PANEL WITH GFR - CBC w/Diff/Platelet - amLODipine (NORVASC) 2.5 MG tablet; Take 1 tablet (2.5 mg total) by mouth daily.  Dispense: 90 tablet; Refill: 3 - lisinopril (ZESTRIL) 20 MG tablet; Take 1 tablet (20 mg total) by mouth daily.  Dispense: 90 tablet; Refill: 1 - Nebivolol HCl 20 MG TABS; Take 1 tablet (20 mg total) by mouth daily.  Dispense: 90 tablet; Refill: 1  2. Mixed hyperlipidemia: Due for fasting labs today.  Continue fenofibrate 145 mg daily, refilled.  - Lipid Profile - fenofibrate (TRICOR) 145 MG tablet; Take 1 tablet (145 mg total) by mouth daily.  Dispense: 90 tablet; Refill: 1  3. Primary insomnia: Stable.  Ambien 10 mg refilled.  Follow-up in 3 months.  - zolpidem (AMBIEN) 10 MG tablet; Take 1 tablet (10 mg total) by mouth at bedtime as needed for sleep.  Dispense: 90 tablet; Refill: 0  4. Need for hepatitis C screening test/Encounter for screening for HIV: Screening labs due.  - Hepatitis C Antibody - HIV antibody (with reflex)   Follow-up: No follow-ups on file.    Teodora Medici, DO

## 2022-01-13 ENCOUNTER — Ambulatory Visit (INDEPENDENT_AMBULATORY_CARE_PROVIDER_SITE_OTHER): Payer: 59 | Admitting: Internal Medicine

## 2022-01-13 ENCOUNTER — Encounter: Payer: Self-pay | Admitting: Ophthalmology

## 2022-01-13 ENCOUNTER — Encounter: Payer: Self-pay | Admitting: Internal Medicine

## 2022-01-13 VITALS — BP 136/82 | HR 92 | Temp 97.9°F | Resp 16 | Ht 64.0 in | Wt 237.9 lb

## 2022-01-13 DIAGNOSIS — E1165 Type 2 diabetes mellitus with hyperglycemia: Secondary | ICD-10-CM

## 2022-01-13 NOTE — Patient Instructions (Addendum)
It was great seeing you today!  Plan discussed at today's visit: -Work on shifting diet to include less sugar and carbs, work on gradually increasing activity and hydration -Plan to recheck A1c at follow up in March  Follow up in: 3/21 - switch to in person exam  Take care and let us know if you have any questions or concerns prior to your next visit.  Dr. Rosana Berger   Type 2 Diabetes Mellitus, Self-Care, Adult Caring for yourself after you have been diagnosed with type 2 diabetes (type 2 diabetes mellitus) means keeping your blood sugar (glucose) under control with a balance of: Nutrition. Exercise. Lifestyle changes. Medicines or insulin, if needed. Support from your team of health care providers and others. What are the risks? Having type 2 diabetes can put you at risk for other long-term (chronic) conditions, such as heart disease and kidney disease. Your health care provider may prescribe medicines to help prevent complications from diabetes. How to monitor your blood glucose  Check your blood glucose every day or as often as told by your health care provider. Have your A1C (hemoglobin A1C) level checked two or more times a year, or as often as told by your health care provider. Your health care provider will set personalized treatment goals for you. Generally, the goal of treatment is to maintain the following blood glucose levels: Before meals: 80-130 mg/dL (4.4-7.2 mmol/L). After meals: below 180 mg/dL (10 mmol/L). A1C level: less than 7%. How to manage hyperglycemia and hypoglycemia Hyperglycemia symptoms Hyperglycemia, also called high blood glucose, occurs when blood glucose is too high. Make sure you know the early signs of hyperglycemia, such as: Increased thirst. Hunger. Feeling very tired. Needing to urinate more often than usual. Blurry vision. Hypoglycemia symptoms Hypoglycemia, also called low blood glucose, occurs with a blood glucose level at or below 70 mg/dL  (3.9 mmol/L). Diabetes medicines lower your blood glucose and can cause hypoglycemia. The risk for hypoglycemia increases during or after exercise, during sleep, during illness, and when skipping meals or not eating for a long time (fasting). It is important to know the symptoms of hypoglycemia and treat it right away. Always have a 15-gram rapid-acting carbohydrate snack with you to treat low blood glucose. Family members and close friends should also know the symptoms and understand how to treat hypoglycemia, in case you are not able to treat yourself. Symptoms may include: Hunger. Anxiety. Sweating and feeling clammy. Dizziness or feeling light-headed. Sleepiness. Increased heart rate. Irritability. Tingling or numbness around the mouth, lips, or tongue. Restless sleep. Severe hypoglycemia is when your blood glucose level is at or below 54 mg/dL (3 mmol/L). Severe hypoglycemia is an emergency. Do not wait to see if the symptoms will go away. Get medical help right away. Call your local emergency services (911 in the [place] Do not drive yourself to the hospital. If you have severe hypoglycemia and you cannot eat or drink, you may need glucagon. A family member or close friend should learn how to check your blood glucose and how to give you glucagon. Ask your health care provider if you need to have an emergency glucagon kit available. Follow these instructions at home: Medicines Take prescribed insulin or diabetes medicines as told by your health care provider. Do not run out of insulin or other diabetes medicines. Plan ahead so you always have these available. If you use insulin, adjust your dosage based on your physical activity and what foods you eat. Your health care provider will  tell you how to adjust your dosage. Take over-the-counter and prescription medicines only as told by your health care provider. Eating and drinking  What you eat and drink affects your blood glucose and your  insulin dosage. Making good choices helps to control your diabetes and prevent other health problems. A healthy meal plan includes eating lean proteins, complex carbohydrates, fresh fruits and vegetables, low-fat dairy products, and healthy fats. Make an appointment to see a registered dietitian to help you create an eating plan that is right for you. Make sure that you: Follow instructions from your health care provider about eating or drinking restrictions. Drink enough fluid to keep your urine pale yellow. Keep a record of the carbohydrates that you eat. Do this by reading food labels and learning the standard serving sizes of foods. Follow your sick-day plan whenever you cannot eat or drink as usual. Make this plan in advance with your health care provider.  Activity Stay active. Exercise regularly, as told by your health care provider. This may include: Stretching and doing strength exercises, such as yoga or weight lifting, two or more times a week. Doing 150 minutes or more of moderate-intensity or vigorous-intensity exercise each week. This could be brisk walking, biking, or water aerobics. Spread out your activity over 3 or more days of the week. Do not go more than 2 days in a row without doing some kind of physical activity. When you start a new exercise or activity, work with your health care provider to adjust your insulin, medicines, or food intake as needed. Lifestyle Do not use any products that contain nicotine or tobacco. These products include cigarettes, chewing tobacco, and vaping devices, such as e-cigarettes. If you need help quitting, ask your health care provider. If you drink alcohol and your health care provider says that it is safe for you: Limit how much you have to: 0-1 drink a day for women who are not pregnant. 0-2 drinks a day for men. Know how much alcohol is in your drink. In the [place] one drink equals one 12 oz bottle of beer (355 mL), one 5 oz glass of wine  (148 mL), or one 1 oz glass of hard liquor (44 mL). Learn to manage stress. If you need help with this, ask your health care provider. Take care of your body  Keep your immunizations up to date. In addition to getting vaccinations as told by your health care provider, it is recommended that you get vaccinated against the following illnesses: The flu (influenza). Get a flu shot every year. Pneumonia. Hepatitis B. Schedule an eye exam soon after your diagnosis, and then one time every year after that. Check your skin and feet every day for cuts, bruises, redness, blisters, or sores. Schedule a foot exam with your health care provider once every year. Brush your teeth and gums two times a day, and floss one or more times a day. Visit your dentist one or more times every 6 months. Maintain a healthy weight. General instructions Share your diabetes management plan with people in your workplace, school, and household. Carry a medical alert card or wear medical alert jewelry. Keep all follow-up visits. This is important. Questions to ask your health care provider Should I meet with a certified diabetes care and education specialist? Where can I find a support group for people with diabetes? Where to find more information For help and guidance and for more information about diabetes, please visit: American Diabetes Association (ADA): www.diabetes.org American Association  of Diabetes Care and Education Specialists (ADCES): www.diabeteseducator.org International Diabetes Federation (IDF): MemberVerification.ca Summary Caring for yourself after you have been diagnosed with type 2 diabetes (type 2 diabetes mellitus) means keeping your blood sugar (glucose) under control with a balance of nutrition, exercise, lifestyle changes, and medicine. Check your blood glucose every day, as often as told by your health care provider. Having diabetes can put you at risk for other long-term (chronic) conditions, such as  heart disease and kidney disease. Your health care provider may prescribe medicines to help prevent complications from diabetes. Share your diabetes management plan with people in your workplace, school, and household. Keep all follow-up visits. This is important. This information is not intended to replace advice given to you by your health care provider. Make sure you discuss any questions you have with your health care provider. Document Revised: 05/20/2020 Document Reviewed: 05/20/2020 Elsevier Patient Education  [place].   Diabetes Mellitus and Nutrition, Adult When you have diabetes, or diabetes mellitus, it is very important to have healthy eating habits because your blood sugar (glucose) levels are greatly affected by what you eat and drink. Eating healthy foods in the right amounts, at about the same times every day, can help you: Manage your blood glucose. Lower your risk of heart disease. Improve your blood pressure. Reach or maintain a healthy weight. What can affect my meal plan? Every person with diabetes is different, and each person has different needs for a meal plan. Your health care provider may recommend that you work with a dietitian to make a meal plan that is best for you. Your meal plan may vary depending on factors such as: The calories you need. The medicines you take. Your weight. Your blood glucose, blood pressure, and cholesterol levels. Your activity level. Other health conditions you have, such as heart or kidney disease. How do carbohydrates affect me? Carbohydrates, also called carbs, affect your blood glucose level more than any other type of food. Eating carbs raises the amount of glucose in your blood. It is important to know how many carbs you can safely have in each meal. This is different for every person. Your dietitian can help you calculate how many carbs you should have at each meal and for each snack. How does alcohol affect me? Alcohol  can cause a decrease in blood glucose (hypoglycemia), especially if you use insulin or take certain diabetes medicines by mouth. Hypoglycemia can be a life-threatening condition. Symptoms of hypoglycemia, such as sleepiness, dizziness, and confusion, are similar to symptoms of having too much alcohol. Do not drink alcohol if: Your health care provider tells you not to drink. You are pregnant, may be pregnant, or are planning to become pregnant. If you drink alcohol: Limit how much you have to: 0-1 drink a day for women. 0-2 drinks a day for men. Know how much alcohol is in your drink. In the [place] one drink equals one 12 oz bottle of beer (355 mL), one 5 oz glass of wine (148 mL), or one 1 oz glass of hard liquor (44 mL). Keep yourself hydrated with water, diet soda, or unsweetened iced tea. Keep in mind that regular soda, juice, and other mixers may contain a lot of sugar and must be counted as carbs. What are tips for following this plan?  Reading food labels Start by checking the serving size on the Nutrition Facts label of packaged foods and drinks. The number of calories and the amount of carbs, fats, and  other nutrients listed on the label are based on one serving of the item. Many items contain more than one serving per package. Check the total grams (g) of carbs in one serving. Check the number of grams of saturated fats and trans fats in one serving. Choose foods that have a low amount or none of these fats. Check the number of milligrams (mg) of salt (sodium) in one serving. Most people should limit total sodium intake to less than 2,300 mg per day. Always check the nutrition information of foods labeled as "low-fat" or "nonfat." These foods may be higher in added sugar or refined carbs and should be avoided. Talk to your dietitian to identify your daily goals for nutrients listed on the label. Shopping Avoid buying canned, pre-made, or processed foods. These foods tend to be high in  fat, sodium, and added sugar. Shop around the outside edge of the grocery store. This is where you will most often find fresh fruits and vegetables, bulk grains, fresh meats, and fresh dairy products. Cooking Use low-heat cooking methods, such as baking, instead of high-heat cooking methods, such as deep frying. Cook using healthy oils, such as olive, canola, or sunflower oil. Avoid cooking with butter, cream, or high-fat meats. Meal planning Eat meals and snacks regularly, preferably at the same times every day. Avoid going long periods of time without eating. Eat foods that are high in fiber, such as fresh fruits, vegetables, beans, and whole grains. Eat 4-6 oz (112-168 g) of lean protein each day, such as lean meat, chicken, fish, eggs, or tofu. One ounce (oz) (28 g) of lean protein is equal to: 1 oz (28 g) of meat, chicken, or fish. 1 egg.  cup (62 g) of tofu. Eat some foods each day that contain healthy fats, such as avocado, nuts, seeds, and fish. What foods should I eat? Fruits Berries. Apples. Oranges. Peaches. Apricots. Plums. Grapes. Mangoes. Papayas. Pomegranates. Kiwi. Cherries. Vegetables Leafy greens, including lettuce, spinach, kale, chard, collard greens, mustard greens, and cabbage. Beets. Cauliflower. Broccoli. Carrots. Green beans. Tomatoes. Peppers. Onions. Cucumbers. Brussels sprouts. Grains Whole grains, such as whole-wheat or whole-grain bread, crackers, tortillas, cereal, and pasta. Unsweetened oatmeal. Quinoa. Brown or wild rice. Meats and other proteins Seafood. Poultry without skin. Lean cuts of poultry and beef. Tofu. Nuts. Seeds. Dairy Low-fat or fat-free dairy products such as milk, yogurt, and cheese. The items listed above may not be a complete list of foods and beverages you can eat and drink. Contact a dietitian for more information. What foods should I avoid? Fruits Fruits canned with syrup. Vegetables Canned vegetables. Frozen vegetables with butter  or cream sauce. Grains Refined white flour and flour products such as bread, pasta, snack foods, and cereals. Avoid all processed foods. Meats and other proteins Fatty cuts of meat. Poultry with skin. Breaded or fried meats. Processed meat. Avoid saturated fats. Dairy Full-fat yogurt, cheese, or milk. Beverages Sweetened drinks, such as soda or iced tea. The items listed above may not be a complete list of foods and beverages you should avoid. Contact a dietitian for more information. Questions to ask a health care provider Do I need to meet with a certified diabetes care and education specialist? Do I need to meet with a dietitian? What number can I call if I have questions? When are the best times to check my blood glucose? Where to find more information: American Diabetes Association: diabetes.org Academy of Nutrition and Dietetics: eatright.[employer] of Diabetes and Digestive and  Kidney Diseases: [employer] Association of Diabetes Care & Education Specialists: diabeteseducator.org Summary It is important to have healthy eating habits because your blood sugar (glucose) levels are greatly affected by what you eat and drink. It is important to use alcohol carefully. A healthy meal plan will help you manage your blood glucose and lower your risk of heart disease. Your health care provider may recommend that you work with a dietitian to make a meal plan that is best for you. This information is not intended to replace advice given to you by your health care provider. Make sure you discuss any questions you have with your health care provider. Document Revised: 07/24/2019 Document Reviewed: 07/24/2019 Elsevier Patient Education  [place].

## 2022-01-24 NOTE — Discharge Instructions (Signed)
   Cataract Surgery, Care After ? ?This sheet gives you information about how to care for yourself after your surgery.  Your ophthalmologist may also give you more specific instructions.  If you have problems or questions, contact your doctor at [facility], [phone]. ? ?What can I expect after the surgery? ?It is common to have: ?Itching ?Foreign body sensation (feels like a grain of sand in the eye) ?Watery discharge (excess tearing) ?Sensitivity to light and touch ?Bruising in or around the eye ?Mild blurred vision ? ?Follow these instructions at home: ?Do not touch or rub your eyes. ?You may be told to wear a protective shield or sunglasses to protect your eyes. ?Do not put a contact lens in the operative eye unless your doctor approves. ?Keep the lids and face clean and dry. ?Do not allow water to hit you directly in the face while showering. ?Keep soap and shampoo out of your eyes. ?Do not use eye makeup for 1 week. ? ?Check your eye every day for signs of infection.  Watch for: ?Redness, swelling, or pain. ?Fluid, blood or pus. ?Worsening vision. ?Worsening sensitivity to light or touch. ? ?Activity: ?During the first day, avoid bending over and reading.  You may resume reading and bending the next day. ?Do not drive or use heavy machinery for at least 24 hours. ?Avoid strenuous activities for 1 week.  Activities such as walking, treadmill, exercise bike, and climbing stairs are okay. ?Do not lift heavy (>20 pound) objects for 1 week. ?Do not do yardwork, gardening, or dirty housework (mopping, cleaning bathrooms, vacuuming, etc.) for 1 week. ?Do not swim or use a hot tub for 2 weeks. ?Ask your doctor when you can return to work. ? ?General Instructions: ?Take or apply prescription and over-the-counter medicines as directed by your doctor, including eyedrops and ointments. ?Resume medications discontinued prior to surgery, unless told otherwise by your doctor. ?Keep all follow up appointments as  scheduled. ? ?Contact a health care provider if: ?You have increased bruising around your eye. ?You have pain that is not helped with medication. ?You have a fever. ?You have fluid, pus, or blood coming from your eye or incision. ?Your sensitivity to light gets worse. ?You have spots (floaters) of flashing lights in your vision. ?You have nausea or vomiting. ? ?Go to the nearest emergency room or call 911 if: ?You have sudden loss of vision. ?You have severe, worsening eye pain. ? ?

## 2022-01-24 NOTE — Anesthesia Preprocedure Evaluation (Addendum)
Anesthesia Evaluation  Patient identified by MRN, date of birth, ID band Patient awake    Reviewed: Allergy & Precautions, NPO status , Patient's Chart, lab work & pertinent test results  History of Anesthesia Complications Negative for: history of anesthetic complications  Airway Mallampati: III   Neck ROM: Full    Dental  (+) Upper Dentures, Lower Dentures   Pulmonary former smoker (quit 5 years ago)   Pulmonary exam normal breath sounds clear to auscultation       Cardiovascular hypertension, Normal cardiovascular exam Rhythm:Regular Rate:Normal     Neuro/Psych  PSYCHIATRIC DISORDERS Anxiety     negative neurological ROS     GI/Hepatic ,GERD  Medicated and Controlled,,  Endo/Other  diabetes, Type 2  Class 3 obesity  Renal/GU negative Renal ROS     Musculoskeletal   Abdominal   Peds  Hematology negative hematology ROS (+)   Anesthesia Other Findings   Reproductive/Obstetrics                             Anesthesia Physical Anesthesia Plan  ASA: 3  Anesthesia Plan: MAC   Post-op Pain Management:    Induction: Intravenous  PONV Risk Score and Plan: 2 and Treatment may vary due to age or medical condition, Midazolam and TIVA  Airway Management Planned: Natural Airway and Nasal Cannula  Additional Equipment:   Intra-op Plan:   Post-operative Plan:   Informed Consent: I have reviewed the patients History and Physical, chart, labs and discussed the procedure including the risks, benefits and alternatives for the proposed anesthesia with the patient or authorized representative who has indicated his/her understanding and acceptance.     Dental advisory given  Plan Discussed with: CRNA  Anesthesia Plan Comments: (LMA/GETA backup discussed.  Patient consented for risks of anesthesia including but not limited to:  - adverse reactions to medications - damage to eyes, teeth,  lips or other oral mucosa - nerve damage due to positioning  - sore throat or hoarseness - damage to heart, brain, nerves, lungs, other parts of body or loss of life  Informed patient about role of CRNA in peri- and intra-operative care.  Patient voiced understanding.)       Anesthesia Quick Evaluation

## 2022-01-25 ENCOUNTER — Ambulatory Visit: Payer: 59 | Admitting: Anesthesiology

## 2022-01-25 ENCOUNTER — Encounter
Admission: RE | Disposition: A | Discharge status: Home / Self Care | Payer: Self-pay | Source: Ambulatory Visit | Attending: Ophthalmology

## 2022-01-25 ENCOUNTER — Encounter: Payer: Self-pay | Admitting: Ophthalmology

## 2022-01-25 ENCOUNTER — Other Ambulatory Visit: Payer: Self-pay

## 2022-01-25 ENCOUNTER — Ambulatory Visit
Admission: RE | Admit: 2022-01-25 | Discharge: 2022-01-25 | Disposition: A | Discharge status: Home / Self Care | Payer: 59 | Source: Ambulatory Visit | Attending: Ophthalmology | Admitting: Ophthalmology

## 2022-01-25 DIAGNOSIS — K219 Gastro-esophageal reflux disease without esophagitis: Secondary | ICD-10-CM | POA: Insufficient documentation

## 2022-01-25 DIAGNOSIS — Z87891 Personal history of nicotine dependence: Secondary | ICD-10-CM | POA: Insufficient documentation

## 2022-01-25 DIAGNOSIS — H2512 Age-related nuclear cataract, left eye: Secondary | ICD-10-CM | POA: Insufficient documentation

## 2022-01-25 DIAGNOSIS — E1136 Type 2 diabetes mellitus with diabetic cataract: Secondary | ICD-10-CM | POA: Insufficient documentation

## 2022-01-25 DIAGNOSIS — E785 Hyperlipidemia, unspecified: Secondary | ICD-10-CM | POA: Diagnosis not present

## 2022-01-25 DIAGNOSIS — Z79899 Other long term (current) drug therapy: Secondary | ICD-10-CM | POA: Insufficient documentation

## 2022-01-25 DIAGNOSIS — E119 Type 2 diabetes mellitus without complications: Secondary | ICD-10-CM

## 2022-01-25 DIAGNOSIS — I10 Essential (primary) hypertension: Secondary | ICD-10-CM | POA: Insufficient documentation

## 2022-01-25 HISTORY — PX: CATARACT EXTRACTION W/PHACO: SHX586

## 2022-01-25 SURGERY — PHACOEMULSIFICATION, CATARACT, WITH IOL INSERTION
Anesthesia: Monitor Anesthesia Care | Site: Eye | Laterality: Left

## 2022-01-25 MED ORDER — FENTANYL CITRATE (PF) 100 MCG/2ML IJ SOLN
INTRAMUSCULAR | Status: DC | PRN
  Administered 2022-01-25: 50 ug via INTRAVENOUS

## 2022-01-25 MED ORDER — BRIMONIDINE TARTRATE-TIMOLOL 0.2-0.5 % OP SOLN
OPHTHALMIC | Status: DC | PRN
  Administered 2022-01-25: 1 [drp] via OPHTHALMIC

## 2022-01-25 MED ORDER — ARMC OPHTHALMIC DILATING DROPS
1.0000 | OPHTHALMIC | Status: DC | PRN
Start: 2022-01-25 — End: 2022-01-25
  Administered 2022-01-25 (×3): 1 via OPHTHALMIC

## 2022-01-25 MED ORDER — ACETAMINOPHEN 160 MG/5ML PO SOLN: 325.0000 mg | ORAL | Status: DC | PRN

## 2022-01-25 MED ORDER — LACTATED RINGERS IV SOLN: INTRAVENOUS | Status: DC

## 2022-01-25 MED ORDER — ACETAMINOPHEN 325 MG PO TABS: 650.0000 mg | ORAL_TABLET | Freq: Once | ORAL | Status: DC | PRN

## 2022-01-25 MED ORDER — SIGHTPATH DOSE#1 BSS IO SOLN
INTRAOCULAR | Status: DC | PRN
  Administered 2022-01-25: 55 mL via OPHTHALMIC

## 2022-01-25 MED ORDER — SIGHTPATH DOSE#1 BSS IO SOLN
INTRAOCULAR | Status: DC | PRN
  Administered 2022-01-25: 15 mL via INTRAOCULAR

## 2022-01-25 MED ORDER — ONDANSETRON HCL 4 MG/2ML IJ SOLN: 4.0000 mg | Freq: Once | INTRAMUSCULAR | Status: DC | PRN

## 2022-01-25 MED ORDER — SIGHTPATH DOSE#1 BSS IO SOLN
INTRAOCULAR | Status: DC | PRN
  Administered 2022-01-25: 2 mL

## 2022-01-25 MED ORDER — SIGHTPATH DOSE#1 NA CHONDROIT SULF-NA HYALURON 40-17 MG/ML IO SOLN
INTRAOCULAR | Status: DC | PRN
  Administered 2022-01-25: 1 mL via INTRAOCULAR

## 2022-01-25 MED ORDER — TETRACAINE HCL 0.5 % OP SOLN
1.0000 [drp] | OPHTHALMIC | Status: DC | PRN
  Administered 2022-01-25 (×3): 1 [drp] via OPHTHALMIC

## 2022-01-25 MED ORDER — MIDAZOLAM HCL 2 MG/2ML IJ SOLN
INTRAMUSCULAR | Status: DC | PRN
  Administered 2022-01-25: 1 mg via INTRAVENOUS

## 2022-01-25 MED ORDER — MOXIFLOXACIN HCL 0.5 % OP SOLN
OPHTHALMIC | Status: DC | PRN
  Administered 2022-01-25: .2 mL via OPHTHALMIC

## 2022-01-25 SURGICAL SUPPLY — 15 items
CANNULA ANT/CHMB 27G (MISCELLANEOUS) IMPLANT
CANNULA ANT/CHMB 27GA (MISCELLANEOUS) IMPLANT
CATARACT SUITE SIGHTPATH (MISCELLANEOUS) ×1 IMPLANT
FEE CATARACT SUITE SIGHTPATH (MISCELLANEOUS) ×1 IMPLANT
GLOVE BIOGEL PI IND STRL 8 (GLOVE) ×1 IMPLANT
GLOVE SURG ENC TEXT LTX SZ8 (GLOVE) ×1 IMPLANT
LENS IOL TECNIS EYHANCE 28.0 (Intraocular Lens) IMPLANT
NDL FILTER BLUNT 18X1 1/2 (NEEDLE) ×1 IMPLANT
NEEDLE FILTER BLUNT 18X1 1/2 (NEEDLE) ×1 IMPLANT
PACK VIT ANT 23G (MISCELLANEOUS) IMPLANT
RING MALYGIN (MISCELLANEOUS) IMPLANT
SUT ETHILON 10-0 CS-B-6CS-B-6 (SUTURE)
SUTURE EHLN 10-0 CS-B-6CS-B-6 (SUTURE) IMPLANT
SYR 3ML LL SCALE MARK (SYRINGE) ×1 IMPLANT
WATER STERILE IRR 250ML POUR (IV SOLUTION) ×1 IMPLANT

## 2022-01-25 NOTE — H&P (Signed)
[facility]   Primary Care Physician:  Teodora Medici, DO Ophthalmologist: Dr. George Ina  Pre-Procedure History & Physical: HPI:  Brooke Schultz is a 62 y.o. female here for cataract surgery.   Past Medical History:  Diagnosis Date   Hyperlipidemia    Hypertension    Insomnia     Past Surgical History:  Procedure Laterality Date   ABDOMINAL HYSTERECTOMY     CATARACT EXTRACTION W/PHACO Right 01/11/2022   Procedure: CATARACT EXTRACTION PHACO AND INTRAOCULAR LENS PLACEMENT (IOC) RIGHT 4.91 00:33.8;  Surgeon: Birder Robson, MD;  Location: [facility];  Service: Ophthalmology;  Laterality: Right;   COLONOSCOPY     COLONOSCOPY WITH PROPOFOL N/A 02/18/2021   Procedure: COLONOSCOPY WITH PROPOFOL;  Surgeon: Jonathon Bellows, MD;  Location[facility] ENDOSCOPY;  Service: Gastroenterology;  Laterality: N/A;   EYE SURGERY     GALLBLADDER SURGERY     OVARIAN CYST REMOVAL     TUBAL LIGATION      Prior to Admission medications   Medication Sig Start Date End Date Taking? Authorizing Provider  Acetaminophen (TYLENOL EXTRA STRENGTH PO) Take 2 tablets by mouth as needed.   Yes [provider]  amLODipine (NORVASC) 2.5 MG tablet Take 1 tablet (2.5 mg total) by mouth daily. 12/23/21  Yes Teodora Medici, DO  fenofibrate (TRICOR) 145 MG tablet Take 1 tablet (145 mg total) by mouth daily. 12/23/21  Yes Teodora Medici, DO  lisinopril (ZESTRIL) 20 MG tablet Take 1 tablet (20 mg total) by mouth daily. 12/23/21  Yes Teodora Medici, DO  Nebivolol HCl 20 MG TABS Take 1 tablet (20 mg total) by mouth daily. 12/23/21  Yes Teodora Medici, DO  zolpidem (AMBIEN) 10 MG tablet Take 1 tablet (10 mg total) by mouth at bedtime as needed for sleep. 12/23/21 03/23/22 Yes Teodora Medici, DO    Allergies as of 11/29/2021 - Review Complete 09/24/2021  Allergen Reaction Noted   Statins Anaphylaxis 08/22/2016   Bactrim [sulfamethoxazole-trimethoprim]  12/07/2016   Paxlovid  [nirmatrelvir-ritonavir] Swelling 09/24/2021    Family History  Problem Relation Age of Onset   Diabetes Mother    Hypertension Mother    Stroke Brother    COPD Maternal Grandmother     Social History   Socioeconomic History   Marital status: Married    Spouse name: Not on file   Number of children: Not on file   Years of education: Not on file   Highest education level: Not on file  Occupational History   Not on file  Tobacco Use   Smoking status: Former    Types: Cigarettes   Smokeless tobacco: Never   Tobacco comments:    been smoke free for 7weeks  Vaping Use   Vaping Use: Former   Devices: JUUL  Substance and Sexual Activity   Alcohol use: Yes    Comment: ocassionally,none last 24hrs   Drug use: No   Sexual activity: Yes  Other Topics Concern   Not on file  Social History Narrative   Not on file   Social Determinants of Health   Financial Resource Strain: Not on file  Food Insecurity: Not on file  Transportation Needs: Not on file  Physical Activity: Not on file  Stress: Not on file  Social Connections: Not on file  Intimate Partner Violence: Not on file    Review of Systems: See HPI, otherwise negative ROS  Physical Exam: BP (!) 151/77   Pulse (!) 56   Temp 98.1 F (36.7 C)   Wt 106.4  kg   SpO2 98%   BMI 40.25 kg/m  General:   Alert, cooperative in NAD Head:  Normocephalic and atraumatic. Respiratory:  Normal work of breathing. Cardiovascular:  RRR  Impression/Plan: Brooke Schultz is here for cataract surgery.  Risks, benefits, limitations, and alternatives regarding cataract surgery have been reviewed with the patient.  Questions have been answered.  All parties agreeable.   Birder Robson, MD  01/25/2022, 8:14 AM

## 2022-01-25 NOTE — Anesthesia Postprocedure Evaluation (Signed)
Anesthesia Post Note  Patient: Jakyla Reza  Procedure(s) Performed: CATARACT EXTRACTION PHACO AND INTRAOCULAR LENS PLACEMENT (IOC) LEFT 6.97 00:38.6 (Left: Eye)  Patient location during evaluation: PACU Anesthesia Type: MAC Level of consciousness: awake and alert, oriented and patient cooperative Pain management: pain level controlled Vital Signs Assessment: post-procedure vital signs reviewed and stable Respiratory status: spontaneous breathing, nonlabored ventilation and respiratory function stable Cardiovascular status: blood pressure returned to baseline and stable Postop Assessment: adequate PO intake Anesthetic complications: no   No notable events documented.   Last Vitals:  Vitals:   01/25/22 0843 01/25/22 0848  BP: 115/73 117/73  Pulse: (!) 49 (!) 50  Resp: 15 14  Temp: (!) 36.1 C (!) 36.1 C  SpO2: 98% 100%    Last Pain:  Vitals:   01/25/22 0848  PainSc: 0-No pain                 Darrin Nipper

## 2022-01-25 NOTE — Transfer of Care (Signed)
Immediate Anesthesia Transfer of Care Note  Patient: Brooke Schultz  Procedure(s) Performed: CATARACT EXTRACTION PHACO AND INTRAOCULAR LENS PLACEMENT (IOC) LEFT 6.97 00:38.6 (Left: Eye)  Patient Location: PACU  Anesthesia Type:MAC  Level of Consciousness: awake, alert , and oriented  Airway & Oxygen Therapy: Patient Spontanous Breathing  Post-op Assessment: Report given to RN  Post vital signs: Reviewed  Last Vitals:  Vitals Value Taken Time  BP 115/73 01/25/22 0843  Temp 36.1 C 01/25/22 0843  Pulse 51 01/25/22 0845  Resp 16 01/25/22 0845  SpO2 97 % 01/25/22 0845  Vitals shown include unvalidated device data.  Last Pain:  Vitals:   01/25/22 0843  PainSc: 0-No pain         Complications: No notable events documented.

## 2022-01-25 NOTE — Op Note (Signed)
PREOPERATIVE DIAGNOSIS:  Nuclear sclerotic cataract of the left eye.   POSTOPERATIVE DIAGNOSIS:  Nuclear sclerotic cataract of the left eye.   OPERATIVE PROCEDURE:ORPROCALL@   SURGEON:  Birder Robson, MD.   ANESTHESIA:  Anesthesiologist: Darrin Nipper, MD CRNA: Gigi Gin, CRNA  1.      Managed anesthesia care. 2.     0.59m of Shugarcaine was instilled following the paracentesis   COMPLICATIONS:  None.   TECHNIQUE:   Stop and chop   DESCRIPTION OF PROCEDURE:  The patient was examined and consented in the preoperative holding area where the aforementioned topical anesthesia was applied to the left eye and then brought back to the Operating Room where the left eye was prepped and draped in the usual sterile ophthalmic fashion and a lid speculum was placed. A paracentesis was created with the side port blade and the anterior chamber was filled with viscoelastic. A near clear corneal incision was performed with the steel keratome. A continuous curvilinear capsulorrhexis was performed with a cystotome followed by the capsulorrhexis forceps. Hydrodissection and hydrodelineation were carried out with BSS on a blunt cannula. The lens was removed in a stop and chop  technique and the remaining cortical material was removed with the irrigation-aspiration handpiece. The capsular bag was inflated with viscoelastic and the Technis [id number] lens was placed in the capsular bag without complication. The remaining viscoelastic was removed from the eye with the irrigation-aspiration handpiece. The wounds were hydrated. The anterior chamber was flushed with BSS and the eye was inflated to physiologic pressure. 0.131mVigamox was placed in the anterior chamber. The wounds were found to be water tight. The eye was dressed with Combigan. The patient was given protective glasses to wear throughout the day and a shield with which to sleep tonight. The patient was also given drops with which to begin a drop regimen  today and will follow-up with me in one day. Implant Name Type Inv. Item Serial No. Manufacturer Lot No. LRB No. Used Action  LENS IOL TECNIS EYHANCE 28.0 - [id number] Lens LENS IOL TECNIS EYHANCE 28.0 [id number]  Left 1 Implanted    Procedure(s): CATARACT EXTRACTION PHACO AND INTRAOCULAR LENS PLACEMENT (IOC) LEFT 6.97 00:38.6 (Left)  Electronically signed: WiBirder Robson/23/2024 8:41 AM

## 2022-01-27 ENCOUNTER — Encounter: Payer: Self-pay | Admitting: Ophthalmology

## 2022-03-20 NOTE — Progress Notes (Unsigned)
Established Patient Office Visit  Subjective:  Patient ID: Brooke Schultz, female    DOB: 11/07/1960  Age: 62 y.o. MRN: [id number]  CC:  No chief complaint on file.   HPI [name] presents to discuss new diagnosis of type 2 diabetes.   Diabetes, Type 2: -Last A1c 12/23 8.1% -Medications: Nothing currently  -Diet: Working on cutting out sugar and carbs in the diet - has lost 10 pounds since diagnosis 3 weeks ago -Exercise: Nothing yet -Eye exam: 12/23 UTD -Foot exam: UTD 1/24 -Microalbumin: Due at follow up -Statin: no -PNA vaccine: not up to date -Denies symptoms of hypoglycemia, polyuria, polydipsia, numbness extremities, foot ulcers/trauma.   Hypertension: -Medications: Lisinopril 20 mg, Bystotlic 20 mg, Amlodipine 2.5 mg -Patient is compliant with above medications and reports no side effects. -Checking BP at home (average): Does not check -Denies any SOB, CP, vision changes, LE edema or symptoms of hypotension.  Has been having slight headaches recently, however she is trying to decrease her nicotine use.  She quit smoking cigarettes about 5 years ago but is vaping.  She recently went down from 5% nicotine to 3%, which is causing some mild headaches. -Diet: drinks a lot  diet coke, trying to switch to leaner proteins - wants to try to lose weight but likes to snack on sugary things    HLD: -Medications: Fenofibrate 145 mg. 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  -Last lipid panel: Lipid Panel     Component Value Date/Time   CHOL 227 (H) 12/23/2021 0822   TRIG 240 (H) 12/23/2021 0822   HDL 46 (L) 12/23/2021 0822   CHOLHDL 4.9 12/23/2021 0822   LDLCALC 142 (H) 12/23/2021 0822    Insomnia: -Currently on Ambien 10 mg, used to take every night x 25 years - decreased to 5 mg late last year but this was not working for her -Had a recent sleep study in 4/22 which recommends repeat sleep study,  patient refuses to wear CPAP -Decreases sugar and caffeine before bed but watches tv and uses phone in bed Did have [place] in July, which made insomnia worse.  Patient was treated with Paxlovid, however she had a presumed allergic reaction to it was unable to finish it.  Symptoms resolved at this point.   Health Maintenance: -Blood work: Due -Mammogram 06/09/21 -Colon cancer screening: Colonoscopy 1/23, repeat in another 3 years  Past Medical History:  Diagnosis Date   Hyperlipidemia    Hypertension    Insomnia     Past Surgical History:  Procedure Laterality Date   ABDOMINAL HYSTERECTOMY     CATARACT EXTRACTION W/PHACO Right 01/11/2022   Procedure: CATARACT EXTRACTION PHACO AND INTRAOCULAR LENS PLACEMENT (IOC) RIGHT 4.91 00:33.8;  Surgeon: Birder Robson, MD;  Location: [place];  Service: Ophthalmology;  Laterality: Right;   CATARACT EXTRACTION W/PHACO Left 01/25/2022   Procedure: CATARACT EXTRACTION PHACO AND INTRAOCULAR LENS PLACEMENT (IOC) LEFT 6.97 00:38.6;  Surgeon: Birder Robson, MD;  Location: [place];  Service: Ophthalmology;  Laterality: Left;   COLONOSCOPY     COLONOSCOPY WITH PROPOFOL N/A 02/18/2021   Procedure: COLONOSCOPY WITH PROPOFOL;  Surgeon: Jonathon Bellows, MD;  Location: [facility] END[facility];  Service: Gastroenterology;  Laterality: N/A;   EYE SURGERY     GALLBLADDER SURGERY     OVARIAN CYST REMOVAL     TUBAL LIGATION      Family History  Problem Relation Age of Onset   Diabetes Mother  Hypertension Mother    Stroke Brother    COPD Maternal Grandmother     Social History   Socioeconomic History   Marital status: Married    Spouse name: Not on file   Number of children: Not on file   Years of education: Not on file   Highest education level: Not on file  Occupational History   Not on file  Tobacco Use   Smoking status: Former    Types: Cigarettes   Smokeless tobacco: Never   Tobacco comments:    been smoke free for 7weeks   Vaping Use   Vaping Use: Former   Devices: JUUL  Substance and Sexual Activity   Alcohol use: Yes    Comment: ocassionally,none last 24hrs   Drug use: No   Sexual activity: Yes  Other Topics Concern   Not on file  Social History Narrative   Not on file   Social Determinants of Health   Financial Resource Strain: Not on file  Food Insecurity: Not on file  Transportation Needs: Not on file  Physical Activity: Not on file  Stress: Not on file  Social Connections: Not on file  Intimate Partner Violence: Not on file    Outpatient Medications Prior to Visit  Medication Sig Dispense Refill   Acetaminophen (TYLENOL EXTRA STRENGTH PO) Take 2 tablets by mouth as needed.     amLODipine (NORVASC) 2.5 MG tablet Take 1 tablet (2.5 mg total) by mouth daily. 90 tablet 3   fenofibrate (TRICOR) 145 MG tablet Take 1 tablet (145 mg total) by mouth daily. 90 tablet 1   lisinopril (ZESTRIL) 20 MG tablet Take 1 tablet (20 mg total) by mouth daily. 90 tablet 1   Nebivolol HCl 20 MG TABS Take 1 tablet (20 mg total) by mouth daily. 90 tablet 1   zolpidem (AMBIEN) 10 MG tablet Take 1 tablet (10 mg total) by mouth at bedtime as needed for sleep. 90 tablet 0   No facility-administered medications prior to visit.    Allergies  Allergen Reactions   Statins Anaphylaxis   Bactrim [Sulfamethoxazole-Trimethoprim]    Paxlovid [Nirmatrelvir-Ritonavir] Swelling    ROS Review of Systems  Constitutional:  Negative for chills and fever.  Eyes:  Negative for visual disturbance.  Respiratory:  Negative for shortness of breath.   Cardiovascular:  Negative for chest pain.  Gastrointestinal:  Negative for abdominal pain.  Endocrine: Negative for polydipsia and polyuria.  Psychiatric/Behavioral:  Negative for sleep disturbance.       Objective:    Physical Exam Constitutional:      Appearance: Normal appearance. She is obese.  HENT:     Head: Normocephalic and atraumatic.  Eyes:      Conjunctiva/sclera: Conjunctivae normal.  Cardiovascular:     Rate and Rhythm: Normal rate and regular rhythm.     Pulses:          Dorsalis pedis pulses are 2+ on the right side and 2+ on the left side.  Pulmonary:     Effort: Pulmonary effort is normal.     Breath sounds: Normal breath sounds.  Musculoskeletal:     Right foot: Normal range of motion. No deformity, bunion, Charcot foot, foot drop or prominent metatarsal heads.     Left foot: Normal range of motion. No deformity, bunion, Charcot foot, foot drop or prominent metatarsal heads.  Feet:     Right foot:     Protective Sensation: 6 sites tested.  6 sites sensed.     Skin  integrity: Skin integrity normal.     Toenail Condition: Right toenails are normal.     Left foot:     Protective Sensation: 6 sites tested.       Skin integrity: Skin integrity normal.     Toenail Condition: Left toenails are normal.  Skin:    General: Skin is warm and dry.  Neurological:     General: No focal deficit present.     Mental Status: She is alert. Mental status is at baseline.  Psychiatric:        Mood and Affect: Mood normal.        Behavior: Behavior normal.     There were no vitals taken for this visit. Wt Readings from Last 3 Encounters:  01/25/22 234 lb 8 oz (106.4 kg)  01/13/22 237 lb 14.4 oz (107.9 kg)  01/11/22 239 lb (108.4 kg)    Health Maintenance Due  Topic Date Due   Diabetic kidney evaluation - Urine ACR  Never done   COVID-19 Vaccine (4 - 2023-24 season) 09/03/2021     There are no preventive care reminders to display for this patient.  No results found for: "TSH" Lab Results  Component Value Date   WBC 4.6 12/23/2021   HGB 13.0 12/23/2021   HCT 38.8 12/23/2021   MCV 88.0 12/23/2021   PLT 349 12/23/2021   Lab Results  Component Value Date   NA 141 12/23/2021   K 4.1 12/23/2021   CO2 29 12/23/2021   GLUCOSE 164 (H) 12/23/2021   BUN 16 12/23/2021   CREATININE 0.76 12/23/2021   BILITOT 0.5 12/23/2021    ALKPHOS 98 08/22/2016   AST 13 12/23/2021   ALT 13 12/23/2021   PROT 6.9 12/23/2021   ALBUMIN 3.7 08/22/2016   CALCIUM 9.2 12/23/2021   ANIONGAP 11 08/22/2016   EGFR 90 12/23/2021   Lab Results  Component Value Date   CHOL 227 (H) 12/23/2021   Lab Results  Component Value Date   HDL 46 (L) 12/23/2021   Lab Results  Component Value Date   LDLCALC 142 (H) 12/23/2021   Lab Results  Component Value Date   TRIG 240 (H) 12/23/2021   Lab Results  Component Value Date   CHOLHDL 4.9 12/23/2021   Lab Results  Component Value Date   HGBA1C 8.1 (H) 12/23/2021      Assessment & Plan:   1. Type 2 diabetes mellitus with hyperglycemia, without long-term current use of insulin ([place]): Newly diagnosed, A1c last month 8.1%. Patient has already been working on decreasing sugar and carbs in the diet and has lost 10 pounds. Would like to continue diet and weight loss prior to starting medications, will plan to recheck A1c in March. If A1c >7.5% will discuss medications. Foot exam today. Urine microalbumin at follow up. Patient has eye doctor and just had cataract surgery, had retinal exam prior. Education about pathology, treatment options and nutrition provided today. Follow up in 2 months.    Follow-up: No follow-ups on file.    Teodora Medici, DO

## 2022-03-21 ENCOUNTER — Ambulatory Visit (INDEPENDENT_AMBULATORY_CARE_PROVIDER_SITE_OTHER): Payer: 59 | Admitting: Internal Medicine

## 2022-03-21 ENCOUNTER — Encounter: Payer: Self-pay | Admitting: Internal Medicine

## 2022-03-21 VITALS — BP 122/82 | HR 76 | Temp 98.1°F | Resp 16 | Ht 64.0 in | Wt 215.8 lb

## 2022-03-21 DIAGNOSIS — F5101 Primary insomnia: Secondary | ICD-10-CM | POA: Diagnosis not present

## 2022-03-21 DIAGNOSIS — E1165 Type 2 diabetes mellitus with hyperglycemia: Secondary | ICD-10-CM | POA: Diagnosis not present

## 2022-03-21 DIAGNOSIS — I10 Essential (primary) hypertension: Secondary | ICD-10-CM

## 2022-03-21 LAB — POCT GLYCOSYLATED HEMOGLOBIN (HGB A1C): Hemoglobin A1C: 6.4 % — AB (ref 4.0–5.6)

## 2022-03-21 MED ORDER — BLOOD PRESSURE MONITOR/L CUFF MISC: 1.0000 | Freq: Every day | 0 refills | Status: DC

## 2022-03-21 MED ORDER — ZOLPIDEM TARTRATE 10 MG PO TABS: 10.0000 mg | ORAL_TABLET | Freq: Every evening | ORAL | 0 refills | Status: DC | PRN

## 2022-03-21 NOTE — Patient Instructions (Addendum)
It was great seeing you today!  Plan discussed at today's visit: -A1c 6.4%, which is in the pre-diabetic range -Continue lifestyle modifications -Ambien refilled  -Keep an eye on blood pressure, may need to lower dose. If BP <110/65 hold Amlodipine   Follow up in: 3 months   Take care and let us know if you have any questions or concerns prior to your next visit.  Dr. Rosana Berger

## 2022-03-22 LAB — MICROALBUMIN / CREATININE URINE RATIO
Creatinine, Urine: 210 mg/dL (ref 20–275)
Microalb Creat Ratio: 3 mg/g creat (ref <30)
Microalb, Ur: 0.6 mg/dL

## 2022-03-24 ENCOUNTER — Ambulatory Visit: Payer: 59 | Admitting: Internal Medicine

## 2022-05-19 ENCOUNTER — Other Ambulatory Visit: Payer: Self-pay | Admitting: Internal Medicine

## 2022-05-19 DIAGNOSIS — I10 Essential (primary) hypertension: Secondary | ICD-10-CM

## 2022-05-19 NOTE — Telephone Encounter (Signed)
Requested Prescriptions  Pending Prescriptions Disp Refills   lisinopril (ZESTRIL) 20 MG tablet [Pharmacy Med Name: LISINOPRIL TABS 20MG ] 90 tablet 0    Sig: TAKE 1 TABLET DAILY     Cardiovascular:  ACE Inhibitors Passed - 05/19/2022 12:51 AM      Passed - Cr in normal range and within 180 days    Creat  Date Value Ref Range Status  12/23/2021 0.76 0.50 - 1.03 mg/dL Final   Creatinine, Urine  Date Value Ref Range Status  03/21/2022 210 20 - 275 mg/dL Final         Passed - K in normal range and within 180 days    Potassium  Date Value Ref Range Status  12/23/2021 4.1 3.5 - 5.3 mmol/L Final  04/13/2012 3.9 3.5 - 5.1 mmol/L Final         Passed - Patient is not pregnant      Passed - Last BP in normal range    BP Readings from Last 1 Encounters:  03/21/22 122/82         Passed - Valid encounter within last 6 months    Recent Outpatient Visits           1 month ago Type 2 diabetes mellitus with hyperglycemia, without long-term current use of insulin [facility])   [facility] Margarita Mail, DO   4 months ago Type 2 diabetes mellitus with hyperglycemia, without long-term current use of insulin [facility])   [place] [facility] Margarita Mail, DO   4 months ago Essential hypertension   [facility] Margarita Mail, DO   7 months ago Essential hypertension   [facility] Margarita Mail, DO   10 months ago COVID-19   [facility] Margarita Mail, DO       Future Appointments             In 1 month Margarita Mail, DO [facility], [facility]

## 2022-06-19 NOTE — Progress Notes (Unsigned)
Established Patient Office Visit  Subjective:  Patient ID: Brooke Schultz, female    DOB: 10-19-1960  Age: 62 y.o. MRN: [id number]  CC:  No chief complaint on file.   HPI Brooke Schultz presents to follow up on chronic medical conditions.  Diabetes, Type 2: -Last A1c 3/24 6.4% -Medications: Nothing currently  -Diet: Working on cutting out sugar and carbs in the diet - has lost a total of 30 pounds since changing diet -Exercise: Nothing yet -Eye exam: 12/23 UTD -Foot exam: UTD 1/24 -Microalbumin: UTD 3/24 -Statin: no -PNA vaccine: not up to date -Denies symptoms of hypoglycemia, polyuria, polydipsia, numbness extremities, foot ulcers/trauma.   Hypertension: -Medications: Lisinopril 20 mg, Bystotlic 20 mg, Amlodipine 2.5 mg -Patient is compliant with above medications and reports no side effects. -Checking BP at home (average): Does not check -Denies any SOB, CP, vision changes, LE edema or symptoms of hypotension.     HLD: -Medications: Fenofibrate 145 mg. 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  -Last lipid panel: Lipid Panel     Component Value Date/Time   CHOL 227 (H) 12/23/2021 0822   TRIG 240 (H) 12/23/2021 0822   HDL 46 (L) 12/23/2021 0822   CHOLHDL 4.9 12/23/2021 0822   LDLCALC 142 (H) 12/23/2021 0822    Insomnia: -Currently on Ambien 10 mg, used to take every night x 25 years - decreased to 5 mg late last year but this was not working for her -Had a recent sleep study in 4/22 which recommends repeat sleep study, patient refuses to wear CPAP -Decreases sugar and caffeine before bed but watches tv and uses phone in bed Did have [place] in July, which made insomnia worse.  Patient was treated with Paxlovid, however she had a presumed allergic reaction to it was unable to finish it.  Symptoms resolved at this point.   Health Maintenance: -Blood work: UTD -Mammogram 06/09/21 -Colon cancer  screening: Colonoscopy 1/23, repeat in another 3 years  Past Medical History:  Diagnosis Date   Hyperlipidemia    Hypertension    Insomnia     Past Surgical History:  Procedure Laterality Date   ABDOMINAL HYSTERECTOMY     CATARACT EXTRACTION W/PHACO Right 01/11/2022   Procedure: CATARACT EXTRACTION PHACO AND INTRAOCULAR LENS PLACEMENT (IOC) RIGHT 4.91 00:33.8;  Surgeon: Galen Manila, MD;  Location: [facility];  Service: Ophthalmology;  Laterality: Right;   CATARACT EXTRACTION W/PHACO Left 01/25/2022   Procedure: CATARACT EXTRACTION PHACO AND INTRAOCULAR LENS PLACEMENT (IOC) LEFT 6.97 00:38.6;  Surgeon: Galen Manila, MD;  Location: [facility];  Service: Ophthalmology;  Laterality: Left;   COLONOSCOPY     COLONOSCOPY WITH PROPOFOL N/A 02/18/2021   Procedure: COLONOSCOPY WITH PROPOFOL;  Surgeon: Wyline Mood, MD;  Location: [facility];  Service: Gastroenterology;  Laterality: N/A;   EYE SURGERY     GALLBLADDER SURGERY     OVARIAN CYST REMOVAL     TUBAL LIGATION      Family History  Problem Relation Age of Onset   Diabetes Mother    Hypertension Mother    Stroke Brother    COPD Maternal Grandmother     Social History   Socioeconomic History   Marital status: Married    Spouse name: Not on file   Number of children: Not on file   Years of education: Not on file   Highest education level: Not on file  Occupational History   Not on file  Tobacco  Use   Smoking status: Former    Types: Cigarettes   Smokeless tobacco: Never   Tobacco comments:    been smoke free for 7weeks  Vaping Use   Vaping Use: Former   Devices: JUUL  Substance and Sexual Activity   Alcohol use: Yes    Comment: ocassionally,none last 24hrs   Drug use: No   Sexual activity: Yes  Other Topics Concern   Not on file  Social History Narrative   Not on file   Social Determinants of Health   Financial Resource Strain: Not on file  Food Insecurity: Not on file   Transportation Needs: Not on file  Physical Activity: Not on file  Stress: Not on file  Social Connections: Not on file  Intimate Partner Violence: Not on file    Outpatient Medications Prior to Visit  Medication Sig Dispense Refill   Acetaminophen (TYLENOL EXTRA STRENGTH PO) Take 2 tablets by mouth as needed.     amLODipine (NORVASC) 2.5 MG tablet Take 1 tablet (2.5 mg total) by mouth daily. 90 tablet 3   Blood Pressure Monitoring (BLOOD PRESSURE MONITOR/L CUFF) MISC 1 each by Does not apply route daily. 1 each 0   fenofibrate (TRICOR) 145 MG tablet Take 1 tablet (145 mg total) by mouth daily. 90 tablet 1   lisinopril (ZESTRIL) 20 MG tablet TAKE 1 TABLET DAILY 90 tablet 0   Nebivolol HCl 20 MG TABS Take 1 tablet (20 mg total) by mouth daily. 90 tablet 1   zolpidem (AMBIEN) 10 MG tablet Take 1 tablet (10 mg total) by mouth at bedtime as needed for sleep. 90 tablet 0   No facility-administered medications prior to visit.    Allergies  Allergen Reactions   Statins Anaphylaxis   Bactrim [Sulfamethoxazole-Trimethoprim]    Paxlovid [Nirmatrelvir-Ritonavir] Swelling    ROS Review of Systems  Constitutional:  Negative for chills and fever.  Eyes:  Negative for visual disturbance.  Respiratory:  Negative for shortness of breath.   Cardiovascular:  Negative for chest pain.  Gastrointestinal:  Negative for abdominal pain.  Endocrine: Negative for polydipsia and polyuria.  Psychiatric/Behavioral:  Negative for sleep disturbance.       Objective:    Physical Exam Constitutional:      Appearance: Normal appearance.  HENT:     Head: Normocephalic and atraumatic.  Eyes:     Conjunctiva/sclera: Conjunctivae normal.  Cardiovascular:     Rate and Rhythm: Normal rate and regular rhythm.  Pulmonary:     Effort: Pulmonary effort is normal.     Breath sounds: Normal breath sounds.  Skin:    General: Skin is warm and dry.  Neurological:     General: No focal deficit present.      Mental Status: She is alert. Mental status is at baseline.  Psychiatric:        Mood and Affect: Mood normal.        Behavior: Behavior normal.     There were no vitals taken for this visit. Wt Readings from Last 3 Encounters:  03/21/22 215 lb 12.8 oz (97.9 kg)  01/25/22 234 lb 8 oz (106.4 kg)  01/13/22 237 lb 14.4 oz (107.9 kg)    Health Maintenance Due  Topic Date Due   Zoster Vaccines- Shingrix (1 of 2) Never done   COVID-19 Vaccine (4 - 2023-24 season) 09/03/2021     There are no preventive care reminders to display for this patient.  No results found for: "TSH" Lab Results  Component  Value Date   WBC 4.6 12/23/2021   HGB 13.0 12/23/2021   HCT 38.8 12/23/2021   MCV 88.0 12/23/2021   PLT 349 12/23/2021   Lab Results  Component Value Date   NA 141 12/23/2021   K 4.1 12/23/2021   CO2 29 12/23/2021   GLUCOSE 164 (H) 12/23/2021   BUN 16 12/23/2021   CREATININE 0.76 12/23/2021   BILITOT 0.5 12/23/2021   ALKPHOS 98 08/22/2016   AST 13 12/23/2021   ALT 13 12/23/2021   PROT 6.9 12/23/2021   ALBUMIN 3.7 08/22/2016   CALCIUM 9.2 12/23/2021   ANIONGAP 11 08/22/2016   EGFR 90 12/23/2021   Lab Results  Component Value Date   CHOL 227 (H) 12/23/2021   Lab Results  Component Value Date   HDL 46 (L) 12/23/2021   Lab Results  Component Value Date   LDLCALC 142 (H) 12/23/2021   Lab Results  Component Value Date   TRIG 240 (H) 12/23/2021   Lab Results  Component Value Date   CHOLHDL 4.9 12/23/2021   Lab Results  Component Value Date   HGBA1C 6.4 (A) 03/21/2022      Assessment & Plan:   1. Type 2 diabetes mellitus with hyperglycemia, without long-term current use of insulin ([facility]): A1c 6.4% today, controlled with diet alone. Efforts congratulated, she also has lost 30 pounds with diet modification. Urine microalbumin today. Recheck in 3 months.   - POCT HgB A1C - Urine Microalbumin w/creat. ratio  2. Essential hypertension: Controlled, blood pressure  cuff sent to pharmacy to monitor that is is not going low with continued weight loss, may need to decrease meds in the future. For now, continue current regiment of Lisinopril 20 mg, Bystotlic 20 mg, Amlodipine 2.5 mg.  - Blood Pressure Monitoring (BLOOD PRESSURE MONITOR/L CUFF) MISC; 1 each by Does not apply route daily.  Dispense: 1 each; Refill: 0  3. Primary insomnia: Stable, refill Ambien 10 mg.   - zolpidem (AMBIEN) 10 MG tablet; Take 1 tablet (10 mg total) by mouth at bedtime as needed for sleep.  Dispense: 90 tablet; Refill: 0    Follow-up: No follow-ups on file.    Margarita Mail, DO

## 2022-06-21 ENCOUNTER — Encounter: Payer: Self-pay | Admitting: Internal Medicine

## 2022-06-21 ENCOUNTER — Ambulatory Visit (INDEPENDENT_AMBULATORY_CARE_PROVIDER_SITE_OTHER): Payer: 59 | Admitting: Internal Medicine

## 2022-06-21 VITALS — BP 118/74 | HR 58 | Temp 97.7°F | Resp 18 | Ht 64.0 in | Wt 191.6 lb

## 2022-06-21 DIAGNOSIS — M1990 Unspecified osteoarthritis, unspecified site: Secondary | ICD-10-CM

## 2022-06-21 DIAGNOSIS — Z1231 Encounter for screening mammogram for malignant neoplasm of breast: Secondary | ICD-10-CM

## 2022-06-21 DIAGNOSIS — F5101 Primary insomnia: Secondary | ICD-10-CM | POA: Diagnosis not present

## 2022-06-21 DIAGNOSIS — I10 Essential (primary) hypertension: Secondary | ICD-10-CM | POA: Diagnosis not present

## 2022-06-21 DIAGNOSIS — E1165 Type 2 diabetes mellitus with hyperglycemia: Secondary | ICD-10-CM

## 2022-06-21 DIAGNOSIS — Z122 Encounter for screening for malignant neoplasm of respiratory organs: Secondary | ICD-10-CM

## 2022-06-21 LAB — POCT GLYCOSYLATED HEMOGLOBIN (HGB A1C): Hemoglobin A1C: 6 % — AB (ref 4.0–5.6)

## 2022-06-21 MED ORDER — NEBIVOLOL HCL 10 MG PO TABS: 10.0000 mg | ORAL_TABLET | Freq: Every day | ORAL | 3 refills | Status: DC

## 2022-06-21 MED ORDER — ZOLPIDEM TARTRATE 10 MG PO TABS: 10.0000 mg | ORAL_TABLET | Freq: Every evening | ORAL | 1 refills | Status: DC | PRN

## 2022-06-21 MED ORDER — CELECOXIB 100 MG PO CAPS: 100.0000 mg | ORAL_CAPSULE | Freq: Every day | ORAL | 0 refills | Status: DC | PRN

## 2022-07-04 ENCOUNTER — Encounter: Payer: Self-pay | Admitting: Internal Medicine

## 2022-07-04 ENCOUNTER — Telehealth (INDEPENDENT_AMBULATORY_CARE_PROVIDER_SITE_OTHER): Payer: 59 | Admitting: Internal Medicine

## 2022-07-04 DIAGNOSIS — F419 Anxiety disorder, unspecified: Secondary | ICD-10-CM

## 2022-07-04 DIAGNOSIS — F5101 Primary insomnia: Secondary | ICD-10-CM

## 2022-07-04 DIAGNOSIS — F4321 Adjustment disorder with depressed mood: Secondary | ICD-10-CM | POA: Diagnosis not present

## 2022-07-04 MED ORDER — CLONAZEPAM 0.5 MG PO TABS: 0.5000 mg | ORAL_TABLET | Freq: Every evening | ORAL | 0 refills | Status: DC | PRN

## 2022-07-04 MED ORDER — ESCITALOPRAM OXALATE 5 MG PO TABS: 5.0000 mg | ORAL_TABLET | Freq: Every day | ORAL | 1 refills | Status: DC

## 2022-07-04 NOTE — Progress Notes (Signed)
Virtual Visit via Video Note  I connected with Brooke Schultz on 07/04/22 at 10:20 AM EDT by a video enabled telemedicine application and verified that I am speaking with the correct person using two identifiers.  Location: Patient: Car parked in parking lot at work Provider: [facility]   I discussed the limitations of evaluation and management by telemedicine and the availability of in person appointments. The patient expressed understanding and agreed to proceed.  History of Present Illness:  Patient is presenting via telemedicine to discuss grief after her brother recently passed away. He was diagnosed with metastatic colon cancer and passed away within 6 days. Patient obviously grieving, having difficulty sleeping despite the Ambien. Not having restful sleep, difficulty falling and staying asleep. Patient also noticed some short term memory difficulty and brain fog. Patient has been working, wanting to feel busy.      07/04/2022   10:15 AM 03/21/2022   10:26 AM 01/13/2022   10:54 AM 12/23/2021    7:52 AM 09/24/2021    1:55 PM  Depression screen PHQ 2/9  Decreased Interest 1 0 0 0 0  Down, Depressed, Hopeless 2 0 0 0 0  PHQ - 2 Score 3 0 0 0 0  Altered sleeping 1 0 0 0 0  Tired, decreased energy 1 0 0 0 0  Change in appetite 0 0 0 0 0  Feeling bad or failure about yourself  0 0 0 0 0  Trouble concentrating 3 0 0 0 0  Moving slowly or fidgety/restless 0 0 0 0 0  Suicidal thoughts 0 0 0 0 0  PHQ-9 Score 8 0 0 0 0  Difficult doing work/chores Very difficult Not difficult at all Not difficult at all Not difficult at all Not difficult at all    Observations/Objective:  General: no acute distress ENT: conjunctiva normal appearing bilaterally  Skin: no rashes, cyanosis or abnormal bruising noted Psych: tearful     Assessment and Plan:  1. Grief/Anxiety/Primary insomnia: Discussed medications at length. Patient states Ambien not helping at this point, will hold and take Klonopin 0.5 mg  at bedtime as needed for the next 2 weeks. Will also start low dose Lexapro, will follow up in 4 weeks to recheck. Potential side effects discussed. Patient knows Brooke Schultz is only for 2 weeks.    - escitalopram (LEXAPRO) 5 MG tablet; Take 1 tablet (5 mg total) by mouth daily.  Dispense: 30 tablet; Refill: 1 - clonazePAM (KLONOPIN) 0.5 MG tablet; Take 1 tablet (0.5 mg total) by mouth at bedtime as needed for anxiety. Do NOT take with Ambien.  Dispense: 15 tablet; Refill: 0  Follow Up Instructions: 1 month     I discussed the assessment and treatment plan with the patient. The patient was provided an opportunity to ask questions and all were answered. The patient agreed with the plan and demonstrated an understanding of the instructions.   The patient was advised to call back or seek an in-person evaluation if the symptoms worsen or if the condition fails to improve as anticipated.  I provided 11 minutes of non-face-to-face time during this encounter.   Margarita Mail, DO

## 2022-08-01 ENCOUNTER — Encounter: Payer: Self-pay | Admitting: Internal Medicine

## 2022-08-02 ENCOUNTER — Other Ambulatory Visit: Payer: Self-pay | Admitting: Internal Medicine

## 2022-08-02 DIAGNOSIS — F4321 Adjustment disorder with depressed mood: Secondary | ICD-10-CM

## 2022-08-02 DIAGNOSIS — F419 Anxiety disorder, unspecified: Secondary | ICD-10-CM

## 2022-08-02 DIAGNOSIS — F5101 Primary insomnia: Secondary | ICD-10-CM

## 2022-08-02 MED ORDER — CLONAZEPAM 0.5 MG PO TABS: 0.5000 mg | ORAL_TABLET | Freq: Every evening | ORAL | 0 refills | Status: DC | PRN

## 2022-08-17 ENCOUNTER — Other Ambulatory Visit: Payer: Self-pay | Admitting: Internal Medicine

## 2022-08-17 DIAGNOSIS — I10 Essential (primary) hypertension: Secondary | ICD-10-CM

## 2022-08-18 NOTE — Telephone Encounter (Signed)
Requested Prescriptions  Pending Prescriptions Disp Refills   lisinopril (ZESTRIL) 20 MG tablet [Pharmacy Med Name: LISINOPRIL TABS 20MG ] 90 tablet 3    Sig: TAKE 1 TABLET DAILY     Cardiovascular:  ACE Inhibitors Failed - 08/17/2022  1:07 AM      Failed - Cr in normal range and within 180 days    Creat  Date Value Ref Range Status  12/23/2021 0.76 0.50 - 1.03 mg/dL Final   Creatinine, Urine  Date Value Ref Range Status  03/21/2022 210 20 - 275 mg/dL Final         Failed - K in normal range and within 180 days    Potassium  Date Value Ref Range Status  12/23/2021 4.1 3.5 - 5.3 mmol/L Final  04/13/2012 3.9 3.5 - 5.1 mmol/L Final         Passed - Patient is not pregnant      Passed - Last BP in normal range    BP Readings from Last 1 Encounters:  06/21/22 118/74         Passed - Valid encounter within last 6 months    Recent Outpatient Visits           1 month ago Grief   [facility] Margarita Mail, DO   1 month ago Type 2 diabetes mellitus with hyperglycemia, without long-term current use of insulin [facility])   [facility] Margarita Mail, DO   5 months ago Type 2 diabetes mellitus with hyperglycemia, without long-term current use of insulin [facility])   [facility] Margarita Mail, DO   7 months ago Type 2 diabetes mellitus with hyperglycemia, without long-term current use of insulin [facility])   [facility] Margarita Mail, DO   7 months ago Essential hypertension   [facility] Margarita Mail, DO       Future Appointments             In 4 months Margarita Mail, DO [facility], [facility]

## 2022-08-22 ENCOUNTER — Other Ambulatory Visit: Payer: Self-pay | Admitting: Internal Medicine

## 2022-08-22 DIAGNOSIS — I10 Essential (primary) hypertension: Secondary | ICD-10-CM

## 2022-08-23 NOTE — Telephone Encounter (Signed)
Requested Prescriptions  Pending Prescriptions Disp Refills   Nebivolol HCl 20 MG TABS [Pharmacy Med Name: NEBIVOLOL TABS 20MG ] 90 tablet 3    Sig: TAKE 1 TABLET DAILY     Cardiovascular: Beta Blockers 3 Passed - 08/22/2022  1:27 AM      Passed - Cr in normal range and within 360 days    Creat  Date Value Ref Range Status  12/23/2021 0.76 0.50 - 1.03 mg/dL Final   Creatinine, Urine  Date Value Ref Range Status  03/21/2022 210 20 - 275 mg/dL Final         Passed - AST in normal range and within 360 days    AST  Date Value Ref Range Status  12/23/2021 13 10 - 35 U/L Final         Passed - ALT in normal range and within 360 days    ALT  Date Value Ref Range Status  12/23/2021 13 6 - 29 U/L Final         Passed - Last BP in normal range    BP Readings from Last 1 Encounters:  06/21/22 118/74         Passed - Last Heart Rate in normal range    Pulse Readings from Last 1 Encounters:  06/21/22 (!) 58         Passed - Valid encounter within last 6 months    Recent Outpatient Visits           1 month ago Grief   [facility] Margarita Mail, DO   2 months ago Type 2 diabetes mellitus with hyperglycemia, without long-term current use of insulin [facility])   [place] [facility] Margarita Mail, DO   5 months ago Type 2 diabetes mellitus with hyperglycemia, without long-term current use of insulin [facility])   [facility] Margarita Mail, DO   7 months ago Type 2 diabetes mellitus with hyperglycemia, without long-term current use of insulin [facility])   [facility] Margarita Mail, DO   8 months ago Essential hypertension   [facility] Margarita Mail, DO       Future Appointments             In 4 months Margarita Mail, DO [facility], [facility]

## 2022-09-01 ENCOUNTER — Other Ambulatory Visit: Payer: Self-pay

## 2022-09-01 DIAGNOSIS — F419 Anxiety disorder, unspecified: Secondary | ICD-10-CM

## 2022-09-01 DIAGNOSIS — F4321 Adjustment disorder with depressed mood: Secondary | ICD-10-CM

## 2022-09-01 MED ORDER — ESCITALOPRAM OXALATE 5 MG PO TABS: 5.0000 mg | ORAL_TABLET | Freq: Every day | ORAL | 1 refills | Status: DC

## 2022-10-19 ENCOUNTER — Other Ambulatory Visit: Payer: Self-pay | Admitting: Internal Medicine

## 2022-10-19 DIAGNOSIS — E782 Mixed hyperlipidemia: Secondary | ICD-10-CM

## 2022-10-19 NOTE — Telephone Encounter (Signed)
Requested Prescriptions  Pending Prescriptions Disp Refills   fenofibrate (TRICOR) 145 MG tablet [Pharmacy Med Name: FENOFIBRATE TABS 145MG ] 90 tablet 1    Sig: TAKE 1 TABLET DAILY     Cardiovascular:  Antilipid - Fibric Acid Derivatives Failed - 10/19/2022  1:18 AM      Failed - Lipid Panel in normal range within the last 12 months    Cholesterol  Date Value Ref Range Status  12/23/2021 227 (H) <200 mg/dL Final   LDL Cholesterol (Calc)  Date Value Ref Range Status  12/23/2021 142 (H) mg/dL (calc) Final    Comment:    Reference range: <100 . Desirable range <100 mg/dL for primary prevention;   <70 mg/dL for patients with CHD or diabetic patients  with > or = 2 CHD risk factors. Marland Kitchen LDL-C is now calculated using the Martin-Hopkins  calculation, which is a validated novel method providing  better accuracy than the Friedewald equation in the  estimation of LDL-C.  Horald Pollen et al. Lenox Ahr. 0981;191(47): 2061-2068  (http://education.Quest[employer].com/faq/FAQ164)    HDL  Date Value Ref Range Status  12/23/2021 46 (L) > OR = 50 mg/dL Final   Triglycerides  Date Value Ref Range Status  12/23/2021 240 (H) <150 mg/dL Final    Comment:    . If a non-fasting specimen was collected, consider repeat triglyceride testing on a fasting specimen if clinically indicated.  Perry Mount et al. J. of Clin. Lipidol. 2015;9:129-169. Marland Kitchen          Passed - ALT in normal range and within 360 days    ALT  Date Value Ref Range Status  12/23/2021 13 6 - 29 U/L Final         Passed - AST in normal range and within 360 days    AST  Date Value Ref Range Status  12/23/2021 13 10 - 35 U/L Final         Passed - Cr in normal range and within 360 days    Creat  Date Value Ref Range Status  12/23/2021 0.76 0.50 - 1.03 mg/dL Final   Creatinine, Urine  Date Value Ref Range Status  03/21/2022 210 20 - 275 mg/dL Final         Passed - HGB in normal range and within 360 days    Hemoglobin  Date  Value Ref Range Status  12/23/2021 13.0 11.7 - 15.5 g/dL Final   HGB  Date Value Ref Range Status  04/18/2012 14.0 12.0 - 16.0 g/dL Final         Passed - HCT in normal range and within 360 days    HCT  Date Value Ref Range Status  12/23/2021 38.8 35.0 - 45.0 % Final  04/18/2012 41.4 35.0 - 47.0 % Final         Passed - PLT in normal range and within 360 days    Platelets  Date Value Ref Range Status  12/23/2021 349 140 - 400 Thousand/uL Final   Platelet  Date Value Ref Range Status  04/18/2012 320 150 - 440 x10 3/mm 3 Final         Passed - WBC in normal range and within 360 days    WBC  Date Value Ref Range Status  12/23/2021 4.6 3.8 - 10.8 Thousand/uL Final         Passed - eGFR is 30 or above and within 360 days    EGFR (African American)  Date Value Ref Range Status  04/13/2012 >60  Final   GFR calc Af Amer  Date Value Ref Range Status  08/22/2016 >60 >60 mL/min Final    Comment:    (NOTE) The eGFR has been calculated using the CKD EPI equation. This calculation has not been validated in all clinical situations. eGFR's persistently <60 mL/min signify possible Chronic Kidney Disease.    EGFR (Non-African Amer.)  Date Value Ref Range Status  04/13/2012 >60  Final    Comment:    eGFR values <70mL/min/1.73 m2 may be an indication of chronic kidney disease (CKD). Calculated eGFR is useful in patients with stable renal function. The eGFR calculation will not be reliable in acutely ill patients when serum creatinine is changing rapidly. It is not useful in  patients on dialysis. The eGFR calculation may not be applicable to patients at the low and high extremes of body sizes, pregnant women, and vegetarians.    GFR calc non Af Amer  Date Value Ref Range Status  08/22/2016 >60 >60 mL/min Final   eGFR  Date Value Ref Range Status  12/23/2021 90 > OR = 60 mL/min/1.83m2 Final         Passed - Valid encounter within last 12 months    Recent Outpatient  Visits           3 months ago Grief   [facility] Margarita Mail, DO   4 months ago Type 2 diabetes mellitus with hyperglycemia, without long-term current use of insulin [facility])   [place] [facility] Margarita Mail, DO   7 months ago Type 2 diabetes mellitus with hyperglycemia, without long-term current use of insulin [facility])   [facility] Margarita Mail, DO   9 months ago Type 2 diabetes mellitus with hyperglycemia, without long-term current use of insulin [facility])   [facility] Margarita Mail, DO   10 months ago Essential hypertension   [facility] Margarita Mail, DO       Future Appointments             In 2 months Margarita Mail, DO [facility], [facility]

## 2022-11-29 LAB — HM MAMMOGRAPHY

## 2022-12-20 NOTE — Progress Notes (Unsigned)
   Established Patient Office Visit  Subjective   Patient ID: Brooke Schultz, female    DOB: [birth year]  Age: 62 y.o. MRN: [id number]  No chief complaint on file.   HPI  Patient is here for follow up on chronic medical conditions.   Diabetes, Type 2: -Last A1c 6/24 6.0% -Medications: Nothing currently  -Diet: Working on cutting out sugar and carbs in the diet - has lost a total of 60 pounds since changing diet -Exercise: Nothing yet -Eye exam: 12/23 UTD -Foot exam: UTD 1/24 -Microalbumin: UTD 3/24 -Statin: no -PNA vaccine: not up to date -Denies symptoms of hypoglycemia, polyuria, polydipsia, numbness extremities, foot ulcers/trauma.   Hypertension: -Medications: Lisinopril 20 mg, Bystotlic 20 mg, had been on Amlodipine 2.5 mg but BP was going down so she takes this one every few days when BP high.  -Patient is compliant with above medications and reports no side effects. -Checking BP at home (average): 110-120/60-70 -Denies any SOB, CP, vision changes, LE edema. Does occasionally get dizzy when bending over.    HLD: -Medications: Fenofibrate 145 mg. 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  -Last lipid panel: Lipid Panel     Component Value Date/Time   CHOL 227 (H) 12/23/2021 0822   TRIG 240 (H) 12/23/2021 0822   HDL 46 (L) 12/23/2021 0822   CHOLHDL 4.9 12/23/2021 0822   LDLCALC 142 (H) 12/23/2021 0822     Insomnia: -Currently on Ambien 10 mg, used to take every night x 25 years - decreased to 5 mg late last year but this was not working for her -Had a recent sleep study in 4/22 which recommends repeat sleep study, patient refuses to wear CPAP -Practices appropriate sleep hygiene   Health Maintenance: -Blood work: UTD -Mammogram 06/09/21 -Colon cancer screening: Colonoscopy 1/23, repeat in another 3 years  {History (Optional):23778}  ROS    Objective:     There were no vitals taken for this  visit. {Vitals History (Optional):23777}  Physical Exam   No results found for any visits on 12/21/22.  {Labs (Optional):23779}  The 10-year ASCVD risk score (Arnett DK, et al., 2019) is: 8.9%    Assessment & Plan:  There are no diagnoses linked to this encounter.   No follow-ups on file.    Margarita Mail, DO

## 2022-12-21 ENCOUNTER — Encounter: Payer: Self-pay | Admitting: Internal Medicine

## 2022-12-21 ENCOUNTER — Other Ambulatory Visit: Payer: Self-pay | Admitting: Internal Medicine

## 2022-12-21 ENCOUNTER — Other Ambulatory Visit: Payer: Self-pay

## 2022-12-21 ENCOUNTER — Ambulatory Visit (INDEPENDENT_AMBULATORY_CARE_PROVIDER_SITE_OTHER): Payer: 59 | Admitting: Internal Medicine

## 2022-12-21 VITALS — BP 124/82 | HR 70 | Temp 97.9°F | Resp 14 | Ht 64.0 in | Wt 162.8 lb

## 2022-12-21 DIAGNOSIS — L209 Atopic dermatitis, unspecified: Secondary | ICD-10-CM

## 2022-12-21 DIAGNOSIS — I10 Essential (primary) hypertension: Secondary | ICD-10-CM | POA: Diagnosis not present

## 2022-12-21 DIAGNOSIS — E1165 Type 2 diabetes mellitus with hyperglycemia: Secondary | ICD-10-CM | POA: Diagnosis not present

## 2022-12-21 DIAGNOSIS — E782 Mixed hyperlipidemia: Secondary | ICD-10-CM | POA: Diagnosis not present

## 2022-12-21 DIAGNOSIS — Z87891 Personal history of nicotine dependence: Secondary | ICD-10-CM

## 2022-12-21 DIAGNOSIS — Z122 Encounter for screening for malignant neoplasm of respiratory organs: Secondary | ICD-10-CM

## 2022-12-21 DIAGNOSIS — F5101 Primary insomnia: Secondary | ICD-10-CM | POA: Diagnosis not present

## 2022-12-21 DIAGNOSIS — Z789 Other specified health status: Secondary | ICD-10-CM

## 2022-12-21 MED ORDER — HYDROCORTISONE 1 % EX CREA: 1.0000 | TOPICAL_CREAM | Freq: Every day | CUTANEOUS | 1 refills | Status: AC | PRN

## 2022-12-21 MED ORDER — ZOLPIDEM TARTRATE 10 MG PO TABS: 10.0000 mg | ORAL_TABLET | Freq: Every evening | ORAL | 1 refills | Status: DC | PRN

## 2022-12-21 MED ORDER — AMLODIPINE BESYLATE 2.5 MG PO TABS: 2.5000 mg | ORAL_TABLET | Freq: Every day | ORAL | 3 refills | Status: DC

## 2022-12-21 MED ORDER — NEBIVOLOL HCL 20 MG PO TABS: 20.0000 mg | ORAL_TABLET | Freq: Every day | ORAL | 3 refills | Status: DC

## 2022-12-21 NOTE — Telephone Encounter (Signed)
Duplicate request- filled today 12/21/22 #90 3RF Requested Prescriptions  Pending Prescriptions Disp Refills   Nebivolol HCl 20 MG TABS [Pharmacy Med Name: NEBIVOLOL TABS 20MG ] 90 tablet 3    Sig: TAKE 1 TABLET DAILY     Cardiovascular: Beta Blockers 3 Failed - 12/21/2022  2:18 PM      Failed - Cr in normal range and within 360 days    Creat  Date Value Ref Range Status  12/23/2021 0.76 0.50 - 1.03 mg/dL Final   Creatinine, Urine  Date Value Ref Range Status  03/21/2022 210 20 - 275 mg/dL Final         Failed - AST in normal range and within 360 days    AST  Date Value Ref Range Status  12/23/2021 13 10 - 35 U/L Final         Failed - ALT in normal range and within 360 days    ALT  Date Value Ref Range Status  12/23/2021 13 6 - 29 U/L Final         Passed - Last BP in normal range    BP Readings from Last 1 Encounters:  12/21/22 124/82         Passed - Last Heart Rate in normal range    Pulse Readings from Last 1 Encounters:  12/21/22 70         Passed - Valid encounter within last 6 months    Recent Outpatient Visits           Today Essential hypertension   [facility] Margarita Mail, DO   5 months ago Grief   [facility] Mail, DO   6 months ago Type 2 diabetes mellitus with hyperglycemia, without long-term current use of insulin [facility])   [facility] Margarita Mail, DO   9 months ago Type 2 diabetes mellitus with hyperglycemia, without long-term current use of insulin [facility])   [place] [facility] Margarita Mail, DO   11 months ago Type 2 diabetes mellitus with hyperglycemia, without long-term current use of insulin [facility])   [place] Margarita Mail, DO       Future Appointments             In 6 months Margarita Mail, DO [facility], [facility]

## 2022-12-21 NOTE — Assessment & Plan Note (Signed)
Discussed use of heavy duty moisturizer, will prescribe steroid cream to use as needed.

## 2022-12-21 NOTE — Assessment & Plan Note (Signed)
Cannot tolerate statins, on fenofibrate.

## 2022-12-21 NOTE — Assessment & Plan Note (Signed)
Stable, refill Ambien.

## 2022-12-21 NOTE — Assessment & Plan Note (Signed)
Patient has done a fantastic job losing weight, BMI 27 today. Will recheck A1c but not on medications for diabetes.

## 2022-12-21 NOTE — Assessment & Plan Note (Signed)
Recheck fasting cholesterol today, cannot take statins due to intolerance. Is on Fenofibrate.

## 2022-12-21 NOTE — Assessment & Plan Note (Signed)
Blood pressure stable here today, appropriate refills sent to pharmacy. Increase Bystolic dose back to 20 mg daily.

## 2022-12-22 LAB — CBC WITH DIFFERENTIAL/PLATELET
Absolute Lymphocytes: 1897 {cells}/uL (ref 850–3900)
Absolute Monocytes: 520 {cells}/uL (ref 200–950)
Basophils Absolute: 41 {cells}/uL (ref 0–200)
Basophils Relative: 0.8 %
Eosinophils Absolute: 92 {cells}/uL (ref 15–500)
Eosinophils Relative: 1.8 %
HCT: 42 % (ref 35.0–45.0)
Hemoglobin: 13.8 g/dL (ref 11.7–15.5)
MCH: 30.1 pg (ref 27.0–33.0)
MCHC: 32.9 g/dL (ref 32.0–36.0)
MCV: 91.7 fL (ref 80.0–100.0)
MPV: 9.8 fL (ref 7.5–12.5)
Monocytes Relative: 10.2 %
Neutro Abs: 2550 {cells}/uL (ref 1500–7800)
Neutrophils Relative %: 50 %
Platelets: 337 10*3/uL (ref 140–400)
RBC: 4.58 10*6/uL (ref 3.80–5.10)
RDW: 12.2 % (ref 11.0–15.0)
Total Lymphocyte: 37.2 %
WBC: 5.1 10*3/uL (ref 3.8–10.8)

## 2022-12-22 LAB — COMPLETE METABOLIC PANEL WITH GFR
AG Ratio: 1.4 (calc) (ref 1.0–2.5)
ALT: 12 U/L (ref 6–29)
AST: 16 U/L (ref 10–35)
Albumin: 4.3 g/dL (ref 3.6–5.1)
Alkaline phosphatase (APISO): 52 U/L (ref 37–153)
BUN: 19 mg/dL (ref 7–25)
CO2: 28 mmol/L (ref 20–32)
Calcium: 10.3 mg/dL (ref 8.6–10.4)
Chloride: 103 mmol/L (ref 98–110)
Creat: 0.68 mg/dL (ref 0.50–1.05)
Globulin: 3 g/dL (ref 1.9–3.7)
Glucose, Bld: 116 mg/dL — ABNORMAL HIGH (ref 65–99)
Potassium: 4.5 mmol/L (ref 3.5–5.3)
Sodium: 141 mmol/L (ref 135–146)
Total Bilirubin: 0.5 mg/dL (ref 0.2–1.2)
Total Protein: 7.3 g/dL (ref 6.1–8.1)
eGFR: 100 mL/min/{1.73_m2} (ref >=60)

## 2022-12-22 LAB — LIPID PANEL
Cholesterol: 228 mg/dL — ABNORMAL HIGH (ref <200)
HDL: 56 mg/dL (ref >=50)
LDL Cholesterol (Calc): 149 mg/dL — ABNORMAL HIGH
Non-HDL Cholesterol (Calc): 172 mg/dL — ABNORMAL HIGH (ref <130)
Total CHOL/HDL Ratio: 4.1 (calc) (ref <5.0)
Triglycerides: 113 mg/dL (ref <150)

## 2022-12-22 LAB — HEMOGLOBIN A1C
Hgb A1c MFr Bld: 5.9 %{Hb} — ABNORMAL HIGH (ref <5.7)
Mean Plasma Glucose: 123 mg/dL
eAG (mmol/L): 6.8 mmol/L

## 2022-12-23 ENCOUNTER — Ambulatory Visit (INDEPENDENT_AMBULATORY_CARE_PROVIDER_SITE_OTHER): Payer: 59 | Admitting: Acute Care

## 2022-12-23 DIAGNOSIS — Z87891 Personal history of nicotine dependence: Secondary | ICD-10-CM

## 2022-12-23 NOTE — Patient Instructions (Signed)

## 2022-12-23 NOTE — Progress Notes (Addendum)
 Provider Attestation I agree with the documentation of the Shared Decision Making visit,  smoking cessation counseling if appropriate, and verification or eligibility for lung cancer screening as documented by the RN Nurse Navigator.   Raejean Bullock, MSN, AGACNP-BC [place] [facility] See Amion for personal pager PCCM on call pager [phone]       Virtual Visit via Telephone Note  I connected with Bianca Bud on 12/23/22 at 10:00 AM EST by telephone and verified that I am speaking with the correct person using two identifiers.  Location: Patient: Brooke Schultz Provider: Alyse Bach, RN   I discussed the limitations, risks, security and privacy concerns of performing an evaluation and management service by telephone and the availability of in person appointments. I also discussed with the patient that there may be a patient responsible charge related to this service. The patient expressed understanding and agreed to proceed.   Shared Decision Making Visit Lung Cancer Screening Program [phone])   Eligibility: Age 62 y.o. Pack Years Smoking History Calculation 60 (# packs/per year x # years smoked) Recent History of coughing up blood  no Unexplained weight loss? no ( >Than 15 pounds within the last 6 months ) Prior History Lung / other cancer no (Diagnosis within the last 5 years already requiring surveillance chest CT Scans). Smoking Status Former Smoker Former Smokers: Years since quit: 5 years  Quit Date: 2019  Visit Components: Discussion included one or more decision making aids. yes Discussion included risk/benefits of screening. yes Discussion included potential follow up diagnostic testing for abnormal scans. yes Discussion included meaning and risk of over diagnosis. yes Discussion included meaning and risk of False Positives. yes Discussion included meaning of total radiation exposure. yes  Counseling Included: Importance of  adherence to annual lung cancer LDCT screening. yes Impact of comorbidities on ability to participate in the program. yes Ability and willingness to under diagnostic treatment. yes  Smoking Cessation Counseling: Current Smokers:  Discussed importance of smoking cessation. yes Information about tobacco cessation classes and interventions provided to patient. yes Patient provided with "ticket" for LDCT Scan. no Symptomatic Patient. no  Counseling(Intermediate counseling: > three minutes) 99406 Diagnosis Code: Tobacco Use Z72.0 Asymptomatic Patient yes  Counseling (Intermediate counseling: > three minutes counseling) [id number] Former Smokers:  Discussed the importance of maintaining cigarette abstinence. yes Diagnosis Code: Personal History of Nicotine Dependence. M57.846 Information about tobacco cessation classes and interventions provided to patient. Yes Patient provided with "ticket" for LDCT Scan. no Written Order for Lung Cancer Screening with LDCT placed in Epic. Yes (CT Chest Lung Cancer Screening Low Dose W/O CM) [id number] Z12.2-Screening of respiratory organs Z87.891-Personal history of nicotine dependence   Alyse Bach, RN

## 2022-12-26 ENCOUNTER — Ambulatory Visit
Admission: RE | Admit: 2022-12-26 | Discharge: 2022-12-26 | Disposition: A | Discharge status: Home / Self Care | Payer: 59 | Source: Ambulatory Visit | Attending: Internal Medicine | Admitting: Internal Medicine

## 2022-12-26 DIAGNOSIS — Z122 Encounter for screening for malignant neoplasm of respiratory organs: Secondary | ICD-10-CM | POA: Diagnosis present

## 2022-12-26 DIAGNOSIS — Z87891 Personal history of nicotine dependence: Secondary | ICD-10-CM | POA: Insufficient documentation

## 2023-01-09 ENCOUNTER — Other Ambulatory Visit: Payer: Self-pay

## 2023-01-09 DIAGNOSIS — F17210 Nicotine dependence, cigarettes, uncomplicated: Secondary | ICD-10-CM

## 2023-01-09 DIAGNOSIS — Z87891 Personal history of nicotine dependence: Secondary | ICD-10-CM

## 2023-01-09 DIAGNOSIS — Z122 Encounter for screening for malignant neoplasm of respiratory organs: Secondary | ICD-10-CM

## 2023-01-27 ENCOUNTER — Other Ambulatory Visit: Payer: Self-pay | Admitting: Internal Medicine

## 2023-01-27 DIAGNOSIS — I10 Essential (primary) hypertension: Secondary | ICD-10-CM

## 2023-01-27 NOTE — Telephone Encounter (Signed)
Requested Prescriptions  Pending Prescriptions Disp Refills   amLODipine (NORVASC) 2.5 MG tablet [Pharmacy Med Name: AMLODIPINE BESYLATE TABS 2.5MG ] 90 tablet 1    Sig: TAKE 1 TABLET DAILY     Cardiovascular: Calcium Channel Blockers 2 Passed - 01/27/2023  1:14 PM      Passed - Last BP in normal range    BP Readings from Last 1 Encounters:  12/21/22 124/82         Passed - Last Heart Rate in normal range    Pulse Readings from Last 1 Encounters:  12/21/22 70         Passed - Valid encounter within last 6 months    Recent Outpatient Visits           1 month ago Essential hypertension   [facility] Margarita Mail, DO   6 months ago Grief   [facility] Margarita Mail, DO   7 months ago Type 2 diabetes mellitus with hyperglycemia, without long-term current use of insulin [facility])   [place] [facility] Margarita Mail, DO   10 months ago Type 2 diabetes mellitus with hyperglycemia, without long-term current use of insulin [facility])   [place] Margarita Mail, DO   1 year ago Type 2 diabetes mellitus with hyperglycemia, without long-term current use of insulin [facility])   [place] [facility] Margarita Mail, DO       Future Appointments             In 4 months Margarita Mail, DO [facility], [facility]

## 2023-02-13 ENCOUNTER — Other Ambulatory Visit: Payer: Self-pay | Admitting: Internal Medicine

## 2023-02-13 DIAGNOSIS — I10 Essential (primary) hypertension: Secondary | ICD-10-CM

## 2023-02-14 NOTE — Telephone Encounter (Signed)
Requested Prescriptions  Pending Prescriptions Disp Refills   lisinopril (ZESTRIL) 20 MG tablet [Pharmacy Med Name: LISINOPRIL TABS 20MG ] 90 tablet 1    Sig: TAKE 1 TABLET DAILY     Cardiovascular:  ACE Inhibitors Passed - 02/14/2023  8:49 AM      Passed - Cr in normal range and within 180 days    Creat  Date Value Ref Range Status  12/21/2022 0.68 0.50 - 1.05 mg/dL Final   Creatinine, Urine  Date Value Ref Range Status  03/21/2022 210 20 - 275 mg/dL Final         Passed - K in normal range and within 180 days    Potassium  Date Value Ref Range Status  12/21/2022 4.5 3.5 - 5.3 mmol/L Final  04/13/2012 3.9 3.5 - 5.1 mmol/L Final         Passed - Patient is not pregnant      Passed - Last BP in normal range    BP Readings from Last 1 Encounters:  12/21/22 124/82         Passed - Valid encounter within last 6 months    Recent Outpatient Visits           1 month ago Essential hypertension   [facility] Margarita Mail, DO   7 months ago Grief   [facility] Margarita Mail, DO   7 months ago Type 2 diabetes mellitus with hyperglycemia, without long-term current use of insulin [facility])   [place] [facility] Margarita Mail, DO   11 months ago Type 2 diabetes mellitus with hyperglycemia, without long-term current use of insulin [facility])   [place] [facility] Margarita Mail, DO   1 year ago Type 2 diabetes mellitus with hyperglycemia, without long-term current use of insulin [facility])   [place] [facility] Margarita Mail, DO       Future Appointments             In 4 months Margarita Mail, DO [facility], [facility]

## 2023-03-24 ENCOUNTER — Encounter: Payer: Self-pay | Admitting: Internal Medicine

## 2023-03-28 ENCOUNTER — Other Ambulatory Visit: Payer: Self-pay | Admitting: Internal Medicine

## 2023-03-28 DIAGNOSIS — L989 Disorder of the skin and subcutaneous tissue, unspecified: Secondary | ICD-10-CM

## 2023-04-20 ENCOUNTER — Other Ambulatory Visit: Payer: Self-pay | Admitting: Internal Medicine

## 2023-04-20 DIAGNOSIS — E782 Mixed hyperlipidemia: Secondary | ICD-10-CM

## 2023-04-21 NOTE — Telephone Encounter (Signed)
 Last OV 12/21/22 with protocol.  Requested Prescriptions  Pending Prescriptions Disp Refills   fenofibrate  (TRICOR ) 145 MG tablet [Pharmacy Med Name: FENOFIBRATE  TABS 145MG ] 90 tablet 0    Sig: TAKE 1 TABLET DAILY     Cardiovascular:  Antilipid - Fibric Acid Derivatives Failed - 04/21/2023  8:34 AM      Failed - Valid encounter within last 12 months    Recent Outpatient Visits   None     Future Appointments             In 2 months Rockney Cid, DO [place] [facility]            Failed - Lipid Panel in normal range within the last 12 months    Cholesterol  Date Value Ref Range Status  12/21/2022 228 (H) <200 mg/dL Final   LDL Cholesterol (Calc)  Date Value Ref Range Status  12/21/2022 149 (H) mg/dL (calc) Final    Comment:    Reference range: <100 . Desirable range <100 mg/dL for primary prevention;   <70 mg/dL for patients with CHD or diabetic patients  with > or = 2 CHD risk factors. Aaron Aas LDL-C is now calculated using the Martin-Hopkins  calculation, which is a validated novel method providing  better accuracy than the Friedewald equation in the  estimation of LDL-C.  Melinda Sprawls et al. Erroll Heard. 4782;956(21): 2061-2068  (http://education.[facility].com/faq/FAQ164)    HDL  Date Value Ref Range Status  12/21/2022 56 > OR = 50 mg/dL Final   Triglycerides  Date Value Ref Range Status  12/21/2022 113 <150 mg/dL Final         Passed - ALT in normal range and within 360 days    ALT  Date Value Ref Range Status  12/21/2022 12 6 - 29 U/L Final         Passed - AST in normal range and within 360 days    AST  Date Value Ref Range Status  12/21/2022 16 10 - 35 U/L Final         Passed - Cr in normal range and within 360 days    Creat  Date Value Ref Range Status  12/21/2022 0.68 0.50 - 1.05 mg/dL Final   Creatinine, Urine  Date Value Ref Range Status  03/21/2022 210 20 - 275 mg/dL Final         Passed - HGB in normal range  and within 360 days    Hemoglobin  Date Value Ref Range Status  12/21/2022 13.8 11.7 - 15.5 g/dL Final   HGB  Date Value Ref Range Status  04/18/2012 14.0 12.0 - 16.0 g/dL Final         Passed - HCT in normal range and within 360 days    HCT  Date Value Ref Range Status  12/21/2022 42.0 35.0 - 45.0 % Final  04/18/2012 41.4 35.0 - 47.0 % Final         Passed - PLT in normal range and within 360 days    Platelets  Date Value Ref Range Status  12/21/2022 337 140 - 400 Thousand/uL Final   Platelet  Date Value Ref Range Status  04/18/2012 320 150 - 440 x10 3/mm 3 Final         Passed - WBC in normal range and within 360 days    WBC  Date Value Ref Range Status  12/21/2022 5.1 3.8 - 10.8 Thousand/uL Final         Passed -  eGFR is 30 or above and within 360 days    EGFR (African American)  Date Value Ref Range Status  04/13/2012 >60  Final   GFR calc Af Amer  Date Value Ref Range Status  08/22/2016 >60 >60 mL/min Final    Comment:    (NOTE) The eGFR has been calculated using the CKD EPI equation. This calculation has not been validated in all clinical situations. eGFR's persistently <60 mL/min signify possible Chronic Kidney Disease.    EGFR (Non-African Amer.)  Date Value Ref Range Status  04/13/2012 >60  Final    Comment:    eGFR values <9mL/min/1.73 m2 may be an indication of chronic kidney disease (CKD). Calculated eGFR is useful in patients with stable renal function. The eGFR calculation will not be reliable in acutely ill patients when serum creatinine is changing rapidly. It is not useful in  patients on dialysis. The eGFR calculation may not be applicable to patients at the low and high extremes of body sizes, pregnant women, and vegetarians.    GFR calc non Af Amer  Date Value Ref Range Status  08/22/2016 >60 >60 mL/min Final   eGFR  Date Value Ref Range Status  12/21/2022 100 > OR = 60 mL/min/1.80m2 Final

## 2023-06-05 ENCOUNTER — Encounter: Payer: Self-pay | Admitting: Acute Care

## 2023-06-07 ENCOUNTER — Telehealth: Payer: Self-pay | Admitting: Internal Medicine

## 2023-06-07 ENCOUNTER — Other Ambulatory Visit: Payer: Self-pay | Admitting: Emergency Medicine

## 2023-06-07 DIAGNOSIS — I10 Essential (primary) hypertension: Secondary | ICD-10-CM

## 2023-06-07 MED ORDER — NEBIVOLOL HCL 20 MG PO TABS
20.0000 mg | ORAL_TABLET | Freq: Every day | ORAL | 0 refills | Status: DC
Start: 2023-06-07 — End: 2023-09-05

## 2023-06-07 NOTE — Telephone Encounter (Signed)
 Nebivolol  HCl 20 MG TABS

## 2023-06-07 NOTE — Telephone Encounter (Signed)
 Medication pend to you for refill. Patient had refills to last til December but she now have to get through mail order [facility] script

## 2023-06-21 ENCOUNTER — Encounter: Payer: Self-pay | Admitting: Internal Medicine

## 2023-06-21 ENCOUNTER — Other Ambulatory Visit: Payer: Self-pay

## 2023-06-21 ENCOUNTER — Ambulatory Visit: Payer: 59 | Admitting: Internal Medicine

## 2023-06-21 VITALS — BP 128/72 | HR 64 | Temp 98.2°F | Resp 16 | Ht 64.0 in | Wt 158.7 lb

## 2023-06-21 DIAGNOSIS — I700 Atherosclerosis of aorta: Secondary | ICD-10-CM | POA: Diagnosis not present

## 2023-06-21 DIAGNOSIS — I2510 Atherosclerotic heart disease of native coronary artery without angina pectoris: Secondary | ICD-10-CM

## 2023-06-21 DIAGNOSIS — I10 Essential (primary) hypertension: Secondary | ICD-10-CM

## 2023-06-21 DIAGNOSIS — E1165 Type 2 diabetes mellitus with hyperglycemia: Secondary | ICD-10-CM

## 2023-06-21 DIAGNOSIS — Z23 Encounter for immunization: Secondary | ICD-10-CM

## 2023-06-21 DIAGNOSIS — E782 Mixed hyperlipidemia: Secondary | ICD-10-CM | POA: Diagnosis not present

## 2023-06-21 DIAGNOSIS — F5101 Primary insomnia: Secondary | ICD-10-CM

## 2023-06-21 LAB — POCT GLYCOSYLATED HEMOGLOBIN (HGB A1C): Hemoglobin A1C: 6.1 % — AB (ref 4.0–5.6)

## 2023-06-21 MED ORDER — AMLODIPINE BESYLATE 2.5 MG PO TABS: 2.5000 mg | ORAL_TABLET | Freq: Every day | ORAL | 1 refills | Status: DC

## 2023-06-21 MED ORDER — ZOLPIDEM TARTRATE 10 MG PO TABS: 10.0000 mg | ORAL_TABLET | Freq: Every evening | ORAL | 1 refills | Status: DC | PRN

## 2023-06-21 MED ORDER — LISINOPRIL 20 MG PO TABS: 20.0000 mg | ORAL_TABLET | Freq: Every day | ORAL | 1 refills | Status: DC

## 2023-06-21 MED ORDER — REPATHA SURECLICK 140 MG/ML ~~LOC~~ SOAJ: 140.0000 mg | SUBCUTANEOUS | 2 refills | Status: DC

## 2023-06-21 NOTE — Progress Notes (Signed)
 Established Patient Office Visit  Subjective   Patient ID: Brooke Schultz, female    DOB: 1960-09-02  Age: 63 y.o. MRN: [id number]  Chief Complaint  Patient presents with   Medical Management of Chronic Issues    6 month recheck    HPI  Patient is here for follow up on chronic medical conditions.   Discussed the use of AI scribe software for clinical note transcription with the patient, who gave verbal consent to proceed.  History of Present Illness  Brooke Schultz is a 63 year old female with hyperlipidemia and coronary artery disease who presents for a follow-up on her cholesterol management and A1c check.  She takes Tricor  (fenofibrate ) for cholesterol management but is allergic to statins. Her cholesterol levels remain elevated despite dietary changes, causing concern about potential health risks. A CT scan last year showed coronary artery calcification and mild atherosclerotic calcification, and she questions the reversibility of these findings.  Her diabetes is managed with a recent A1c of 6.1, up from 5.9, which she attributes to increased fruit consumption. She monitors her blood sugar closely and is aware of fluctuations.  Her current medications include lisinopril  and amlodipine  for blood pressure, which is well-controlled. Her weight is stable, and she eats frequently. She is due for a pneumonia vaccine, having turned 62 this year and with a history of pneumonia.   Diabetes, Type 2: -Last A1c 12/24 5.9% -Medications: Nothing currently  -Diet: Working on cutting out sugar and carbs in the diet - has lost a total of 60 pounds since changing diet -Exercise: Nothing yet -Eye exam: Due -Foot exam: Due today -Microalbumin: Due today -Statin: no -PNA vaccine: not up to date - getting today -Denies symptoms of hypoglycemia, polyuria, polydipsia, numbness extremities, foot ulcers/trauma.   Hypertension: -Medications: Lisinopril  20 mg, Bystotlic 20 mg, Amlodipine  2.5 mg -  had tried to decrease Bystolic  dose to 10 mg but BP was high -Patient is compliant with above medications and reports no side effects. -Checking BP at home (average): 110-120/60-70 -Denies any SOB, CP, vision changes, LE edema. Does occasionally get dizzy when bending over.    HLD: -Medications: Fenofibrate  145 mg. 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  -Last lipid panel: Lipid Panel     Component Value Date/Time   CHOL 228 (H) 12/21/2022 1006   TRIG 113 12/21/2022 1006   HDL 56 12/21/2022 1006   CHOLHDL 4.1 12/21/2022 1006   LDLCALC 149 (H) 12/21/2022 1006   The 10-year ASCVD risk score (Arnett DK, et al., 2019) is: 9.2%   Values used to calculate the score:     Age: 10 years     Clincally relevant sex: Female     Is Non-Hispanic African American: No     Diabetic: Yes     Tobacco smoker: No     Systolic Blood Pressure: 128 mmHg     Is BP treated: Yes     HDL Cholesterol: 56 mg/dL     Total Cholesterol: 228 mg/dL   Insomnia: -Currently on Ambien  10 mg, used to take every night x 25 years - decreased to 5 mg late last year but this was not working for her -Had a recent sleep study in 4/22 which recommends repeat sleep study, patient refuses to wear CPAP -Practices appropriate sleep hygiene   Health Maintenance: -Blood work: UTD -Mammogram 11/24 Birads-1  -Colon cancer screening: Colonoscopy 1/23, repeat in another 3 years. Brother passed away  from colon cancer.   Patient Active Problem List   Diagnosis Date Noted   Type 2 diabetes mellitus with hyperglycemia, without long-term current use of insulin (HCC) 12/21/2022   Atopic dermatitis 12/21/2022   Statin intolerance 12/21/2022   BMI 40.0-44.9, adult (HCC) 03/23/2021   Acute upper respiratory infection 09/08/2019   Cough 09/08/2019   Encounter for general adult medical examination with abnormal findings 10/13/2018   Acute pain of right knee  10/13/2018   Dysuria 10/13/2018   Screening for breast cancer 06/10/2018   B12 deficiency 10/12/2017   Mild obesity 10/12/2017   Flu vaccine need 10/12/2017   Mixed hyperlipidemia 05/03/2017   GAD (generalized anxiety disorder) 05/03/2017   Primary insomnia 05/03/2017   Essential hypertension 01/20/2017   Past Medical History:  Diagnosis Date   Hyperlipidemia    Hypertension    Insomnia    Past Surgical History:  Procedure Laterality Date   ABDOMINAL HYSTERECTOMY     CATARACT EXTRACTION W/PHACO Right 01/11/2022   Procedure: CATARACT EXTRACTION PHACO AND INTRAOCULAR LENS PLACEMENT (IOC) RIGHT 4.91 00:33.8;  Surgeon: Clair Crews, MD;  Location: [facility];  Service: Ophthalmology;  Laterality: Right;   CATARACT EXTRACTION W/PHACO Left 01/25/2022   Procedure: CATARACT EXTRACTION PHACO AND INTRAOCULAR LENS PLACEMENT (IOC) LEFT 6.97 00:38.6;  Surgeon: Clair Crews, MD;  Location: [facility];  Service: Ophthalmology;  Laterality: Left;   COLONOSCOPY     COLONOSCOPY WITH PROPOFOL  N/A 02/18/2021   Procedure: COLONOSCOPY WITH PROPOFOL ;  Surgeon: Luke Salaam, MD;  Location: [facility] END[facility];  Service: Gastroenterology;  Laterality: N/A;   EYE SURGERY     GALLBLADDER SURGERY     OVARIAN CYST REMOVAL     TUBAL LIGATION     Social History   Tobacco Use   Smoking status: Former    Current packs/day: 0.00    Average packs/day: 1.5 packs/day for 40.0 years (60.0 ttl pk-yrs)    Types: Cigarettes    Start date: 66    Quit date: 2019    Years since quitting: 6.4   Smokeless tobacco: Never   Tobacco comments:    been smoke free for 7weeks  Vaping Use   Vaping status: Former   Devices: JUUL  Substance Use Topics   Alcohol use: Yes    Comment: ocassionally,none last 24hrs   Drug use: No   Social History   Socioeconomic History   Marital status: Married    Spouse name: Not on file   Number of children: Not on file   Years of education: Not on file    Highest education level: 12th grade  Occupational History   Not on file  Tobacco Use   Smoking status: Former    Current packs/day: 0.00    Average packs/day: 1.5 packs/day for 40.0 years (60.0 ttl pk-yrs)    Types: Cigarettes    Start date: 89    Quit date: 2019    Years since quitting: 6.4   Smokeless tobacco: Never   Tobacco comments:    been smoke free for 7weeks  Vaping Use   Vaping status: Former   Devices: JUUL  Substance and Sexual Activity   Alcohol use: Yes    Comment: ocassionally,none last 24hrs   Drug use: No   Sexual activity: Yes  Other Topics Concern   Not on file  Social History Narrative   Not on file   Social Drivers of Health   Financial Resource Strain: Low Risk  (12/20/2022)   Overall Physicist, medical Strain (  CARDIA)    Difficulty of Paying Living Expenses: Not hard at all  Food Insecurity: No Food Insecurity (12/20/2022)   Hunger Vital Sign    Worried About Running Out of Food in the Last Year: Never true    Ran Out of Food in the Last Year: Never true  Transportation Needs: No Transportation Needs (12/20/2022)   PRAPARE - Administrator, Civil Service (Medical): No    Lack of Transportation (Non-Medical): No  Physical Activity: Unknown (12/20/2022)   Exercise Vital Sign    Days of Exercise per Week: 0 days    Minutes of Exercise per Session: Not on file  Stress: No Stress Concern Present (12/20/2022)   [employer] of Occupational Health - Occupational Stress Questionnaire    Feeling of Stress : Not at all  Social Connections: Unknown (12/20/2022)   Social Connection and Isolation Panel    Frequency of Communication with Friends and Family: More than three times a week    Frequency of Social Gatherings with Friends and Family: Twice a week    Attends Religious Services: Patient declined    Database administrator or Organizations: No    Attends Engineer, structural: Not on file    Marital Status: Married   Catering manager Violence: Not on file   Family Status  Relation Name Status   Mother  Alive   Sister 2 Alive   Brother 3 Alive   Son 4 Alive   MGM  Deceased  No partnership data on file   Family History  Problem Relation Age of Onset   Diabetes Mother    Hypertension Mother    Stroke Brother    COPD Maternal Grandmother    Allergies  Allergen Reactions   Statins Anaphylaxis   Bactrim [Sulfamethoxazole-Trimethoprim]    Paxlovid  [Nirmatrelvir -Ritonavir ] Swelling    Review of Systems  All other systems reviewed and are negative.     Objective:     BP 128/72 (Cuff Size: Large)   Pulse 64   Temp 98.2 F (36.8 C) (Oral)   Resp 16   Ht 5' 4 (1.626 m)   Wt 158 lb 11.2 oz (72 kg)   SpO2 100%   BMI 27.24 kg/m  BP Readings from Last 3 Encounters:  06/21/23 128/72  12/21/22 124/82  06/21/22 118/74   Wt Readings from Last 3 Encounters:  06/21/23 158 lb 11.2 oz (72 kg)  12/21/22 162 lb 12.8 oz (73.8 kg)  06/21/22 191 lb 9.6 oz (86.9 kg)      Physical Exam Constitutional:      Appearance: Normal appearance.  HENT:     Head: Normocephalic and atraumatic.   Eyes:     Conjunctiva/sclera: Conjunctivae normal.    Cardiovascular:     Rate and Rhythm: Normal rate and regular rhythm.     Pulses:          Dorsalis pedis pulses are 2+ on the right side and 2+ on the left side.  Pulmonary:     Effort: Pulmonary effort is normal.     Breath sounds: Normal breath sounds.   Musculoskeletal:     Right foot: Normal range of motion. No deformity, bunion, Charcot foot, foot drop or prominent metatarsal heads.     Left foot: Normal range of motion. No deformity, bunion, Charcot foot, foot drop or prominent metatarsal heads.  Feet:     Right foot:     Protective Sensation: 6 sites tested.  6 sites sensed.  Skin integrity: Skin integrity normal.     Toenail Condition: Right toenails are normal.     Left foot:     Protective Sensation: 6 sites tested.  6 sites  sensed.     Skin integrity: Skin integrity normal.     Toenail Condition: Left toenails are normal.   Skin:    General: Skin is warm and dry.   Neurological:     General: No focal deficit present.     Mental Status: She is alert. Mental status is at baseline.   Psychiatric:        Mood and Affect: Mood normal.        Behavior: Behavior normal.      No results found for any visits on 06/21/23.  Last CBC Lab Results  Component Value Date   WBC 5.1 12/21/2022   HGB 13.8 12/21/2022   HCT 42.0 12/21/2022   MCV 91.7 12/21/2022   MCH 30.1 12/21/2022   RDW 12.2 12/21/2022   PLT 337 12/21/2022   Last metabolic panel Lab Results  Component Value Date   GLUCOSE 116 (H) 12/21/2022   NA 141 12/21/2022   K 4.5 12/21/2022   CL 103 12/21/2022   CO2 28 12/21/2022   BUN 19 12/21/2022   CREATININE 0.68 12/21/2022   EGFR 100 12/21/2022   CALCIUM 10.3 12/21/2022   PROT 7.3 12/21/2022   ALBUMIN 3.7 08/22/2016   BILITOT 0.5 12/21/2022   ALKPHOS 98 08/22/2016   AST 16 12/21/2022   ALT 12 12/21/2022   ANIONGAP 11 08/22/2016   Last lipids Lab Results  Component Value Date   CHOL 228 (H) 12/21/2022   HDL 56 12/21/2022   LDLCALC 149 (H) 12/21/2022   TRIG 113 12/21/2022   CHOLHDL 4.1 12/21/2022   Last hemoglobin A1c Lab Results  Component Value Date   HGBA1C 5.9 (H) 12/21/2022   Last thyroid  functions No results found for: TSH, T3TOTAL, T4TOTAL, THYROIDAB Last vitamin D No results found for: 25OHVITD2, 25OHVITD3, VD25OH Last vitamin B12 and Folate No results found for: VITAMINB12, FOLATE    The 10-year ASCVD risk score (Arnett DK, et al., 2019) is: 9.2%    Assessment & Plan:   Assessment & Plan  Hyperlipidemia/CAD/Aortic Atherosclerosis  Hyperlipidemia managed with fenofibrate , cannot tolerate statins. Cholesterol elevated, ASCVD risk 9%. Discussed Repatha for stronger cholesterol reduction, requires prior authorization. - Prescribe Repatha and  initiate prior authorization process. - Continue fenofibrate  (Tricor ). - Recheck cholesterol levels in 6 months.  Type 2 Diabetes Mellitus Type 2 Diabetes well-controlled with A1c of 6.1, managed with lifestyle modifications. - Perform A1c test. - Perform microalbumin urine test. - Conduct foot exam.  Hypertension Hypertension well-controlled with lisinopril  and amlodipine . - Refill lisinopril . - Refill amlodipine .  Insomnia Ongoing management for insomnia with Ambien . - Refill Ambien .  General Health Maintenance Due for pneumonia vaccine, benefits and side effects explained. - Administer pneumonia vaccine.  Follow-up Scheduled follow-up in six months to reassess conditions and treatment effectiveness. - Schedule follow-up appointment in 6 months.  - POCT HgB A1C - Urine Microalbumin w/creat. ratio - HM Diabetes Foot Exam - Evolocumab (REPATHA SURECLICK) 140 MG/ML SOAJ; Inject 140 mg into the skin every 14 (fourteen) days.  Dispense: 2 mL; Refill: 2 - lisinopril  (ZESTRIL ) 20 MG tablet; Take 1 tablet (20 mg total) by mouth daily.  Dispense: 90 tablet; Refill: 1 - amLODipine  (NORVASC ) 2.5 MG tablet; Take 1 tablet (2.5 mg total) by mouth daily.  Dispense: 90 tablet; Refill: 1 - zolpidem  (AMBIEN )  10 MG tablet; Take 1 tablet (10 mg total) by mouth at bedtime as needed for sleep.  Dispense: 90 tablet; Refill: 1 - Pneumococcal conjugate vaccine 20-valent (Prevnar 20)   Return in about 6 months (around 12/21/2023).    Rockney Cid, DO

## 2023-06-22 ENCOUNTER — Ambulatory Visit: Payer: Self-pay | Admitting: Internal Medicine

## 2023-06-22 LAB — MICROALBUMIN / CREATININE URINE RATIO
Creatinine, Urine: 33 mg/dL (ref 20–275)
Microalb Creat Ratio: 6 mg/g{creat} (ref <30)
Microalb, Ur: 0.2 mg/dL

## 2023-07-20 ENCOUNTER — Other Ambulatory Visit: Payer: Self-pay | Admitting: Internal Medicine

## 2023-07-20 DIAGNOSIS — E782 Mixed hyperlipidemia: Secondary | ICD-10-CM

## 2023-07-21 NOTE — Telephone Encounter (Signed)
 Requested Prescriptions  Pending Prescriptions Disp Refills   fenofibrate  (TRICOR ) 145 MG tablet [Pharmacy Med Name: FENOFIBRATE  TABS 145MG ] 90 tablet 1    Sig: TAKE 1 TABLET DAILY     Cardiovascular:  Antilipid - Fibric Acid Derivatives Failed - 07/21/2023 11:13 AM      Failed - Lipid Panel in normal range within the last 12 months    Cholesterol  Date Value Ref Range Status  12/21/2022 228 (H) <200 mg/dL Final   LDL Cholesterol (Calc)  Date Value Ref Range Status  12/21/2022 149 (H) mg/dL (calc) Final    Comment:    Reference range: <100 . Desirable range <100 mg/dL for primary prevention;   <70 mg/dL for patients with CHD or diabetic patients  with > or = 2 CHD risk factors. SABRA LDL-C is now calculated using the Martin-Hopkins  calculation, which is a validated novel method providing  better accuracy than the Friedewald equation in the  estimation of LDL-C.  Gladis APPLETHWAITE et al. SANDREA. 7986;689(80): 2061-2068  (http://education.[facility].com/faq/FAQ164)    HDL  Date Value Ref Range Status  12/21/2022 56 > OR = 50 mg/dL Final   Triglycerides  Date Value Ref Range Status  12/21/2022 113 <150 mg/dL Final         Passed - ALT in normal range and within 360 days    ALT  Date Value Ref Range Status  12/21/2022 12 6 - 29 U/L Final         Passed - AST in normal range and within 360 days    AST  Date Value Ref Range Status  12/21/2022 16 10 - 35 U/L Final         Passed - Cr in normal range and within 360 days    Creat  Date Value Ref Range Status  12/21/2022 0.68 0.50 - 1.05 mg/dL Final   Creatinine, Urine  Date Value Ref Range Status  06/21/2023 33 20 - 275 mg/dL Final         Passed - HGB in normal range and within 360 days    Hemoglobin  Date Value Ref Range Status  12/21/2022 13.8 11.7 - 15.5 g/dL Final   HGB  Date Value Ref Range Status  04/18/2012 14.0 12.0 - 16.0 g/dL Final         Passed - HCT in normal range and within 360 days    HCT   Date Value Ref Range Status  12/21/2022 42.0 35.0 - 45.0 % Final  04/18/2012 41.4 35.0 - 47.0 % Final         Passed - PLT in normal range and within 360 days    Platelets  Date Value Ref Range Status  12/21/2022 337 140 - 400 Thousand/uL Final   Platelet  Date Value Ref Range Status  04/18/2012 320 150 - 440 x10 3/mm 3 Final         Passed - WBC in normal range and within 360 days    WBC  Date Value Ref Range Status  12/21/2022 5.1 3.8 - 10.8 Thousand/uL Final         Passed - eGFR is 30 or above and within 360 days    EGFR (African American)  Date Value Ref Range Status  04/13/2012 >60  Final   GFR calc Af Amer  Date Value Ref Range Status  08/22/2016 >60 >60 mL/min Final    Comment:    (NOTE) The eGFR has been calculated using the CKD EPI equation. This calculation  has not been validated in all clinical situations. eGFR's persistently <60 mL/min signify possible Chronic Kidney Disease.    EGFR (Non-African Amer.)  Date Value Ref Range Status  04/13/2012 >60  Final    Comment:    eGFR values <75mL/min/1.73 m2 may be an indication of chronic kidney disease (CKD). Calculated eGFR is useful in patients with stable renal function. The eGFR calculation will not be reliable in acutely ill patients when serum creatinine is changing rapidly. It is not useful in  patients on dialysis. The eGFR calculation may not be applicable to patients at the low and high extremes of body sizes, pregnant women, and vegetarians.    GFR calc non Af Amer  Date Value Ref Range Status  08/22/2016 >60 >60 mL/min Final   eGFR  Date Value Ref Range Status  12/21/2022 100 > OR = 60 mL/min/1.74m2 Final         Passed - Valid encounter within last 12 months    Recent Outpatient Visits           1 month ago Type 2 diabetes mellitus with hyperglycemia, without long-term current use of insulin [facility])   [facility] Bernardo Fend, DO       Future  Appointments             In 5 months Bernardo Fend, DO [facility], [facility]

## 2023-07-26 ENCOUNTER — Other Ambulatory Visit: Payer: Self-pay | Admitting: Internal Medicine

## 2023-07-26 DIAGNOSIS — I10 Essential (primary) hypertension: Secondary | ICD-10-CM

## 2023-07-27 NOTE — Telephone Encounter (Signed)
 Pt wanting to send to mail order Requested Prescriptions  Pending Prescriptions Disp Refills   amLODipine  (NORVASC ) 2.5 MG tablet [Pharmacy Med Name: AMLODIPINE  BESYLATE TABS 2.5MG ] 90 tablet 1    Sig: TAKE 1 TABLET DAILY     Cardiovascular: Calcium Channel Blockers 2 Passed - 07/27/2023  3:58 PM      Passed - Last BP in normal range    BP Readings from Last 1 Encounters:  06/21/23 128/72         Passed - Last Heart Rate in normal range    Pulse Readings from Last 1 Encounters:  06/21/23 64         Passed - Valid encounter within last 6 months    Recent Outpatient Visits           1 month ago Type 2 diabetes mellitus with hyperglycemia, without long-term current use of insulin [facility])   [facility] Bernardo Fend, DO       Future Appointments             In 4 months Bernardo Fend, DO [facility], [facility]

## 2023-08-14 ENCOUNTER — Other Ambulatory Visit: Payer: Self-pay | Admitting: Internal Medicine

## 2023-08-14 DIAGNOSIS — I10 Essential (primary) hypertension: Secondary | ICD-10-CM

## 2023-08-17 NOTE — Telephone Encounter (Signed)
 Change of pharmacy  Requested Prescriptions  Pending Prescriptions Disp Refills   lisinopril  (ZESTRIL ) 20 MG tablet [Pharmacy Med Name: LISINOPRIL  TABS 20MG ] 90 tablet 0    Sig: TAKE 1 TABLET DAILY     Cardiovascular:  ACE Inhibitors Failed - 08/17/2023  8:44 AM      Failed - Cr in normal range and within 180 days    Creat  Date Value Ref Range Status  12/21/2022 0.68 0.50 - 1.05 mg/dL Final   Creatinine, Urine  Date Value Ref Range Status  06/21/2023 33 20 - 275 mg/dL Final         Failed - K in normal range and within 180 days    Potassium  Date Value Ref Range Status  12/21/2022 4.5 3.5 - 5.3 mmol/L Final  04/13/2012 3.9 3.5 - 5.1 mmol/L Final         Passed - Patient is not pregnant      Passed - Last BP in normal range    BP Readings from Last 1 Encounters:  06/21/23 128/72         Passed - Valid encounter within last 6 months    Recent Outpatient Visits           1 month ago Type 2 diabetes mellitus with hyperglycemia, without long-term current use of insulin [facility])   [facility] Bernardo Fend, DO       Future Appointments             In 4 months Bernardo Fend, DO [facility], [facility]

## 2023-09-05 ENCOUNTER — Other Ambulatory Visit: Payer: Self-pay | Admitting: Internal Medicine

## 2023-09-05 DIAGNOSIS — I10 Essential (primary) hypertension: Secondary | ICD-10-CM

## 2023-09-05 NOTE — Telephone Encounter (Signed)
 Requested Prescriptions  Pending Prescriptions Disp Refills   Nebivolol  HCl 20 MG TABS [Pharmacy Med Name: NEBIVOLOL  TABS 20MG ] 90 tablet 1    Sig: TAKE 1 TABLET DAILY     Cardiovascular: Beta Blockers 3 Passed - 09/05/2023  3:32 PM      Passed - Cr in normal range and within 360 days    Creat  Date Value Ref Range Status  12/21/2022 0.68 0.50 - 1.05 mg/dL Final   Creatinine, Urine  Date Value Ref Range Status  06/21/2023 33 20 - 275 mg/dL Final         Passed - AST in normal range and within 360 days    AST  Date Value Ref Range Status  12/21/2022 16 10 - 35 U/L Final         Passed - ALT in normal range and within 360 days    ALT  Date Value Ref Range Status  12/21/2022 12 6 - 29 U/L Final         Passed - Last BP in normal range    BP Readings from Last 1 Encounters:  06/21/23 128/72         Passed - Last Heart Rate in normal range    Pulse Readings from Last 1 Encounters:  06/21/23 64         Passed - Valid encounter within last 6 months    Recent Outpatient Visits           2 months ago Type 2 diabetes mellitus with hyperglycemia, without long-term current use of insulin [facility])   [place] [facility] Bernardo Fend, DO       Future Appointments             In 3 months Bernardo Fend, DO [facility], [place]

## 2023-09-13 ENCOUNTER — Other Ambulatory Visit: Payer: Self-pay | Admitting: Internal Medicine

## 2023-09-13 DIAGNOSIS — I700 Atherosclerosis of aorta: Secondary | ICD-10-CM

## 2023-09-13 DIAGNOSIS — E1165 Type 2 diabetes mellitus with hyperglycemia: Secondary | ICD-10-CM

## 2023-09-13 DIAGNOSIS — E782 Mixed hyperlipidemia: Secondary | ICD-10-CM

## 2023-09-13 DIAGNOSIS — I2510 Atherosclerotic heart disease of native coronary artery without angina pectoris: Secondary | ICD-10-CM

## 2023-09-14 NOTE — Telephone Encounter (Signed)
 Requested Prescriptions  Pending Prescriptions Disp Refills   Evolocumab  (REPATHA  SURECLICK) 140 MG/ML SOAJ [Pharmacy Med Name: REPATHA  SRCLK 140MG /ML INJ LATEX FR] 2 mL 0    Sig: INJECT 140 MG UNDER THE SKIN EVERY 14 DAYS     Cardiovascular: PCSK9 Inhibitors Passed - 09/14/2023  1:35 PM      Passed - Valid encounter within last 12 months    Recent Outpatient Visits           2 months ago Type 2 diabetes mellitus with hyperglycemia, without long-term current use of insulin [facility])   [place] [facility] Bernardo Fend, DO       Future Appointments             In 3 months Bernardo Fend, DO [place] [facility], [place] - Lipid Panel completed within the last 12 months    Cholesterol  Date Value Ref Range Status  12/21/2022 228 (H) <200 mg/dL Final   LDL Cholesterol (Calc)  Date Value Ref Range Status  12/21/2022 149 (H) mg/dL (calc) Final    Comment:    Reference range: <100 . Desirable range <100 mg/dL for primary prevention;   <70 mg/dL for patients with CHD or diabetic patients  with > or = 2 CHD risk factors. SABRA LDL-C is now calculated using the Martin-Hopkins  calculation, which is a validated novel method providing  better accuracy than the Friedewald equation in the  estimation of LDL-C.  Gladis APPLETHWAITE et al. SANDREA. 7986;689(80): 2061-2068  (http://education.[facility].com/faq/FAQ164)    HDL  Date Value Ref Range Status  12/21/2022 56 > OR = 50 mg/dL Final   Triglycerides  Date Value Ref Range Status  12/21/2022 113 <150 mg/dL Final

## 2023-10-15 ENCOUNTER — Other Ambulatory Visit: Payer: Self-pay | Admitting: Internal Medicine

## 2023-10-15 DIAGNOSIS — E1165 Type 2 diabetes mellitus with hyperglycemia: Secondary | ICD-10-CM

## 2023-10-15 DIAGNOSIS — I700 Atherosclerosis of aorta: Secondary | ICD-10-CM

## 2023-10-15 DIAGNOSIS — E782 Mixed hyperlipidemia: Secondary | ICD-10-CM

## 2023-10-15 DIAGNOSIS — I2510 Atherosclerotic heart disease of native coronary artery without angina pectoris: Secondary | ICD-10-CM

## 2023-10-17 NOTE — Telephone Encounter (Signed)
 Requested Prescriptions  Pending Prescriptions Disp Refills   Evolocumab  (REPATHA  SURECLICK) 140 MG/ML SOAJ [Pharmacy Med Name: REPATHA  SRCLK 140MG /ML INJ LATEX FR] 2 mL 2    Sig: INJECT 140 MG UNDER THE SKIN EVERY 14 DAYS     Cardiovascular: PCSK9 Inhibitors Passed - 10/17/2023  1:53 PM      Passed - Valid encounter within last 12 months    Recent Outpatient Visits           3 months ago Type 2 diabetes mellitus with hyperglycemia, without long-term current use of insulin [facility])   [place] [facility] Bernardo Fend, DO       Future Appointments             In 2 months Bernardo Fend, DO [place] [facility], [place] - Lipid Panel completed within the last 12 months    Cholesterol  Date Value Ref Range Status  12/21/2022 228 (H) <200 mg/dL Final   LDL Cholesterol (Calc)  Date Value Ref Range Status  12/21/2022 149 (H) mg/dL (calc) Final    Comment:    Reference range: <100 . Desirable range <100 mg/dL for primary prevention;   <70 mg/dL for patients with CHD or diabetic patients  with > or = 2 CHD risk factors. SABRA LDL-C is now calculated using the Martin-Hopkins  calculation, which is a validated novel method providing  better accuracy than the Friedewald equation in the  estimation of LDL-C.  Gladis APPLETHWAITE et al. SANDREA. 7986;689(80): 2061-2068  (http://education.[facility].com/faq/FAQ164)    HDL  Date Value Ref Range Status  12/21/2022 56 > OR = 50 mg/dL Final   Triglycerides  Date Value Ref Range Status  12/21/2022 113 <150 mg/dL Final

## 2023-11-02 ENCOUNTER — Telehealth: Payer: Self-pay

## 2023-11-02 NOTE — Telephone Encounter (Signed)
 LVM to schedule annual Lung CT.

## 2023-11-13 ENCOUNTER — Other Ambulatory Visit: Payer: Self-pay | Admitting: Internal Medicine

## 2023-11-13 DIAGNOSIS — I10 Essential (primary) hypertension: Secondary | ICD-10-CM

## 2023-11-14 NOTE — Telephone Encounter (Signed)
 Requested medications are due for refill today.  yes  Requested medications are on the active medications list.  yes  Last refill. 08/17/2023 #90 0 rf  Future visit scheduled.   yes  Notes to clinic.  Labs are expired.    Requested Prescriptions  Pending Prescriptions Disp Refills   lisinopril  (ZESTRIL ) 20 MG tablet [Pharmacy Med Name: LISINOPRIL  TABS 20MG ] 90 tablet 3    Sig: TAKE 1 TABLET DAILY     Cardiovascular:  ACE Inhibitors Failed - 11/14/2023  1:48 PM      Failed - Cr in normal range and within 180 days    Creat  Date Value Ref Range Status  12/21/2022 0.68 0.50 - 1.05 mg/dL Final   Creatinine, Urine  Date Value Ref Range Status  06/21/2023 33 20 - 275 mg/dL Final         Failed - K in normal range and within 180 days    Potassium  Date Value Ref Range Status  12/21/2022 4.5 3.5 - 5.3 mmol/L Final  04/13/2012 3.9 3.5 - 5.1 mmol/L Final         Passed - Patient is not pregnant      Passed - Last BP in normal range    BP Readings from Last 1 Encounters:  06/21/23 128/72         Passed - Valid encounter within last 6 months    Recent Outpatient Visits           4 months ago Type 2 diabetes mellitus with hyperglycemia, without long-term current use of insulin [facility])   [place] [facility] Bernardo Fend, DO       Future Appointments             In 1 month Bernardo Fend, DO [facility], [place]

## 2023-11-14 NOTE — Telephone Encounter (Signed)
 Pt req through mail order ok to give 90? Pt has an appt coming up in Dec?

## 2023-12-21 ENCOUNTER — Other Ambulatory Visit: Payer: Self-pay

## 2023-12-21 ENCOUNTER — Encounter: Payer: Self-pay | Admitting: Internal Medicine

## 2023-12-21 ENCOUNTER — Ambulatory Visit: Admitting: Internal Medicine

## 2023-12-21 VITALS — BP 126/74 | HR 54 | Temp 97.8°F | Resp 16 | Ht 64.0 in | Wt 163.5 lb

## 2023-12-21 DIAGNOSIS — I10 Essential (primary) hypertension: Secondary | ICD-10-CM | POA: Diagnosis not present

## 2023-12-21 DIAGNOSIS — Z1211 Encounter for screening for malignant neoplasm of colon: Secondary | ICD-10-CM | POA: Diagnosis not present

## 2023-12-21 DIAGNOSIS — E782 Mixed hyperlipidemia: Secondary | ICD-10-CM

## 2023-12-21 DIAGNOSIS — I700 Atherosclerosis of aorta: Secondary | ICD-10-CM | POA: Diagnosis not present

## 2023-12-21 DIAGNOSIS — I2510 Atherosclerotic heart disease of native coronary artery without angina pectoris: Secondary | ICD-10-CM

## 2023-12-21 DIAGNOSIS — E1165 Type 2 diabetes mellitus with hyperglycemia: Secondary | ICD-10-CM

## 2023-12-21 DIAGNOSIS — Z86006 Personal history of melanoma in-situ: Secondary | ICD-10-CM | POA: Diagnosis not present

## 2023-12-21 DIAGNOSIS — F5101 Primary insomnia: Secondary | ICD-10-CM

## 2023-12-21 DIAGNOSIS — Z1231 Encounter for screening mammogram for malignant neoplasm of breast: Secondary | ICD-10-CM | POA: Diagnosis not present

## 2023-12-21 MED ORDER — ZOLPIDEM TARTRATE 10 MG PO TABS: 10.0000 mg | ORAL_TABLET | Freq: Every evening | ORAL | 1 refills | Status: AC | PRN

## 2023-12-21 MED ORDER — REPATHA SURECLICK 140 MG/ML ~~LOC~~ SOAJ: 140.0000 mg | SUBCUTANEOUS | 5 refills | Status: DC

## 2023-12-21 MED ORDER — AMLODIPINE BESYLATE 2.5 MG PO TABS: 2.5000 mg | ORAL_TABLET | Freq: Every day | ORAL | 1 refills | Status: AC

## 2023-12-21 MED ORDER — FENOFIBRATE 145 MG PO TABS: 145.0000 mg | ORAL_TABLET | Freq: Every day | ORAL | 1 refills | Status: AC

## 2023-12-21 MED ORDER — LISINOPRIL 20 MG PO TABS: 20.0000 mg | ORAL_TABLET | Freq: Every day | ORAL | 0 refills | Status: AC

## 2023-12-21 NOTE — Progress Notes (Signed)
 Established Patient Office Visit  Subjective   Patient ID: Brooke Schultz, female    DOB: July 30, 1960  Age: 63 y.o. MRN: [id number]  Chief Complaint  Patient presents with   Medical Management of Chronic Issues    6 month recheck    HPI  Patient is here for follow up on chronic medical conditions.   Discussed the use of AI scribe software for clinical note transcription with the patient, who gave verbal consent to proceed.  History of Present Illness Brooke Schultz is a 63 year old female who presents for her annual physical exam and lab work.  She is here for annual labs, including kidney and liver function, electrolytes, anemia screening, and lipid panel. She is particularly focused on her cholesterol, as she has been on Repatha  for about six months for elevated LDL in addition to lifestyle changes.  She takes lisinopril , Repatha , nebivolol , amlodipine , Tricor , and Ambien .  She had a mammogram in November of last year. Dermatology recently excised a stage 0 melanoma, and she is on a six-month follow-up schedule with Dr. Jackquline at [facility].  Diabetes, Type 2: -Last A1c 6/25 6.1% -Medications: Nothing currently  -Diet: Working on cutting out sugar and carbs in the diet - has lost a total of 60 pounds since changing diet -Exercise: Nothing yet -Eye exam: Due -Foot exam: Due today -Microalbumin: Due today -Statin: no -PNA vaccine: not up to date - getting today -Denies symptoms of hypoglycemia, polyuria, polydipsia, numbness extremities, foot ulcers/trauma.   Hypertension: -Medications: Lisinopril  20 mg, Bystotlic 20 mg, Amlodipine  2.5 mg - had tried to decrease Bystolic  dose to 10 mg but BP was high -Patient is compliant with above medications and reports no side effects. -Checking BP at home (average): 110-120/60-70 -Denies any SOB, CP, vision changes, LE edema. Does occasionally get dizzy when bending over.    HLD: -Medications: Now on Repatha  and  Fenofibrate  145 mg. 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  -Last lipid panel: Lipid Panel     Component Value Date/Time   CHOL 228 (H) 12/21/2022 1006   TRIG 113 12/21/2022 1006   HDL 56 12/21/2022 1006   CHOLHDL 4.1 12/21/2022 1006   LDLCALC 149 (H) 12/21/2022 1006   The 10-year ASCVD risk score (Arnett DK, et al., 2019) is: 9.8%   Values used to calculate the score:     Age: 67 years     Clinically relevant sex: Female     Is Non-Hispanic African American: No     Diabetic: Yes     Tobacco smoker: No     Systolic Blood Pressure: 126 mmHg     Is BP treated: Yes     HDL Cholesterol: 56 mg/dL     Total Cholesterol: 228 mg/dL   Insomnia: -Currently on Ambien  10 mg, used to take every night x 25 years - decreased to 5 mg late last year but this was not working for her -Had a recent sleep study in 4/22 which recommends repeat sleep study, patient refuses to wear CPAP -Practices appropriate sleep hygiene   Health Maintenance: -Blood work: due -Mammogram 11/24 Birads-1, due  -Colon cancer screening: Colonoscopy 1/23, repeat in another 3 years. Brother passed away from colon cancer. Due and referral placed.   Patient Active Problem List   Diagnosis Date Noted   Type 2 diabetes mellitus with hyperglycemia, without long-term current use of insulin (HCC) 12/21/2022   Atopic dermatitis 12/21/2022  Statin intolerance 12/21/2022   BMI 40.0-44.9, adult (HCC) 03/23/2021   Acute upper respiratory infection 09/08/2019   Cough 09/08/2019   Encounter for general adult medical examination with abnormal findings 10/13/2018   Acute pain of right knee 10/13/2018   Dysuria 10/13/2018   Screening for breast cancer 06/10/2018   B12 deficiency 10/12/2017   Mild obesity 10/12/2017   Flu vaccine need 10/12/2017   Mixed hyperlipidemia 05/03/2017   GAD (generalized anxiety disorder) 05/03/2017   Primary insomnia 05/03/2017    Essential hypertension 01/20/2017   Past Medical History:  Diagnosis Date   Hyperlipidemia    Hypertension    Insomnia    Past Surgical History:  Procedure Laterality Date   ABDOMINAL HYSTERECTOMY     CATARACT EXTRACTION W/PHACO Right 01/11/2022   Procedure: CATARACT EXTRACTION PHACO AND INTRAOCULAR LENS PLACEMENT (IOC) RIGHT 4.91 00:33.8;  Surgeon: Jaye Fallow, MD;  [facility];  Service: Ophthalmology;  Laterality: Right;   CATARACT EXTRACTION W/PHACO Left 01/25/2022   Procedure: CATARACT EXTRACTION PHACO AND INTRAOCULAR LENS PLACEMENT (IOC) LEFT 6.97 00:38.6;  Surgeon: Jaye Fallow, MD;  Location: [facility];  Service: Ophthalmology;  Laterality: Left;   COLONOSCOPY     COLONOSCOPY WITH PROPOFOL  N/A 02/18/2021   Procedure: COLONOSCOPY WITH PROPOFOL ;  Surgeon: Therisa Bi, MD;  Location: [facility];  Service: Gastroenterology;  Laterality: N/A;   EYE SURGERY     GALLBLADDER SURGERY     OVARIAN CYST REMOVAL     TUBAL LIGATION     Social History   Tobacco Use   Smoking status: Former    Current packs/day: 0.00    Average packs/day: 1.5 packs/day for 40.0 years (60.0 ttl pk-yrs)    Types: Cigarettes    Start date: 25    Quit date: 2019    Years since quitting: 6.9   Smokeless tobacco: Never   Tobacco comments:    been smoke free for 7weeks  Vaping Use   Vaping status: Former   Devices: JUUL  Substance Use Topics   Alcohol use: Yes    Comment: ocassionally,none last 24hrs   Drug use: No   Social History   Socioeconomic History   Marital status: Married    Spouse name: Not on file   Number of children: Not on file   Years of education: Not on file   Highest education level: 12th grade  Occupational History   Not on file  Tobacco Use   Smoking status: Former    Current packs/day: 0.00    Average packs/day: 1.5 packs/day for 40.0 years (60.0 ttl pk-yrs)    Types: Cigarettes    Start date: 24    Quit date: 2019    Years  since quitting: 6.9   Smokeless tobacco: Never   Tobacco comments:    been smoke free for 7weeks  Vaping Use   Vaping status: Former   Devices: JUUL  Substance and Sexual Activity   Alcohol use: Yes    Comment: ocassionally,none last 24hrs   Drug use: No   Sexual activity: Yes  Other Topics Concern   Not on file  Social History Narrative   Not on file   Social Drivers of Health   Tobacco Use: Medium Risk (12/21/2023)   Patient History    Smoking Tobacco Use: Former    Smokeless Tobacco Use: Never    Passive Exposure: Not on file  Financial Resource Strain: Low Risk (12/20/2022)   Overall Financial Resource Strain (CARDIA)    Difficulty of  Paying Living Expenses: Not hard at all  Food Insecurity: No Food Insecurity (12/20/2022)   Hunger Vital Sign    Worried About Running Out of Food in the Last Year: Never true    Ran Out of Food in the Last Year: Never true  Transportation Needs: No Transportation Needs (12/20/2022)   PRAPARE - Administrator, Civil Service (Medical): No    Lack of Transportation (Non-Medical): No  Physical Activity: Unknown (12/20/2022)   Exercise Vital Sign    Days of Exercise per Week: 0 days    Minutes of Exercise per Session: Not on file  Stress: No Stress Concern Present (12/20/2022)   [employer] of Occupational Health - Occupational Stress Questionnaire    Feeling of Stress : Not at all  Social Connections: Unknown (12/20/2022)   Social Connection and Isolation Panel    Frequency of Communication with Friends and Family: More than three times a week    Frequency of Social Gatherings with Friends and Family: Twice a week    Attends Religious Services: Patient declined    Database Administrator or Organizations: No    Attends Engineer, Structural: Not on file    Marital Status: Married  Catering Manager Violence: Not on file  Depression (PHQ2-9): Low Risk (12/21/2023)   Depression (PHQ2-9)    PHQ-2 Score: 0   Alcohol Screen: Low Risk (12/21/2022)   Alcohol Screen    Last Alcohol Screening Score (AUDIT): 0  Housing: Low Risk (12/20/2022)   Housing Stability Vital Sign    Unable to Pay for Housing in the Last Year: No    Number of Times Moved in the Last Year: 0    Homeless in the Last Year: No  Utilities: Not on file  Health Literacy: Not on file   Family Status  Relation Name Status   Mother  Alive   Sister 2 Alive   Brother 3 Alive   Son 4 Alive   MGM  Deceased  No partnership data on file   Family History  Problem Relation Age of Onset   Diabetes Mother    Hypertension Mother    Stroke Brother    COPD Maternal Grandmother    Allergies  Allergen Reactions   Statins Anaphylaxis   Bactrim [Sulfamethoxazole-Trimethoprim]    Paxlovid  [Nirmatrelvir -Ritonavir ] Swelling    Review of Systems  All other systems reviewed and are negative.     Objective:     BP 126/74 (Cuff Size: Normal)   Pulse (!) 54   Temp 97.8 F (36.6 C) (Oral)   Resp 16   Ht 5' 4 (1.626 m)   Wt 163 lb 8 oz (74.2 kg)   SpO2 99%   BMI 28.06 kg/m  BP Readings from Last 3 Encounters:  12/21/23 126/74  06/21/23 128/72  12/21/22 124/82   Wt Readings from Last 3 Encounters:  12/21/23 163 lb 8 oz (74.2 kg)  06/21/23 158 lb 11.2 oz (72 kg)  12/21/22 162 lb 12.8 oz (73.8 kg)      Physical Exam Constitutional:      Appearance: Normal appearance.  HENT:     Head: Normocephalic and atraumatic.  Eyes:     Conjunctiva/sclera: Conjunctivae normal.  Cardiovascular:     Rate and Rhythm: Normal rate and regular rhythm.     Pulses:          Dorsalis pedis pulses are 2+ on the right side and 2+ on the left side.  Pulmonary:  Effort: Pulmonary effort is normal.     Breath sounds: Normal breath sounds.  Musculoskeletal:     Right foot: Normal range of motion. No deformity, bunion, Charcot foot, foot drop or prominent metatarsal heads.     Left foot: Normal range of motion. No deformity, bunion,  Charcot foot, foot drop or prominent metatarsal heads.  Feet:     Right foot:     Protective Sensation: 6 sites tested.  6 sites sensed.     Skin integrity: Skin integrity normal.     Toenail Condition: Right toenails are normal.     Left foot:     Protective Sensation: 6 sites tested.  6 sites sensed.     Skin integrity: Skin integrity normal.     Toenail Condition: Left toenails are normal.  Skin:    General: Skin is warm and dry.  Neurological:     General: No focal deficit present.     Mental Status: She is alert. Mental status is at baseline.  Psychiatric:        Mood and Affect: Mood normal.        Behavior: Behavior normal.      No results found for any visits on 12/21/23.  Last CBC Lab Results  Component Value Date   WBC 5.1 12/21/2022   HGB 13.8 12/21/2022   HCT 42.0 12/21/2022   MCV 91.7 12/21/2022   MCH 30.1 12/21/2022   RDW 12.2 12/21/2022   PLT 337 12/21/2022   Last metabolic panel Lab Results  Component Value Date   GLUCOSE 116 (H) 12/21/2022   NA 141 12/21/2022   K 4.5 12/21/2022   CL 103 12/21/2022   CO2 28 12/21/2022   BUN 19 12/21/2022   CREATININE 0.68 12/21/2022   EGFR 100 12/21/2022   CALCIUM 10.3 12/21/2022   PROT 7.3 12/21/2022   ALBUMIN 3.7 08/22/2016   BILITOT 0.5 12/21/2022   ALKPHOS 98 08/22/2016   AST 16 12/21/2022   ALT 12 12/21/2022   ANIONGAP 11 08/22/2016   Last lipids Lab Results  Component Value Date   CHOL 228 (H) 12/21/2022   HDL 56 12/21/2022   LDLCALC 149 (H) 12/21/2022   TRIG 113 12/21/2022   CHOLHDL 4.1 12/21/2022   Last hemoglobin A1c Lab Results  Component Value Date   HGBA1C 6.1 (A) 06/21/2023   Last thyroid  functions No results found for: TSH, T3TOTAL, T4TOTAL, THYROIDAB Last vitamin D No results found for: 25OHVITD2, 25OHVITD3, VD25OH Last vitamin B12 and Folate No results found for: VITAMINB12, FOLATE    The 10-year ASCVD risk score (Arnett DK, et al., 2019) is: 9.8%     Assessment & Plan:   Assessment & Plan Type 2 diabetes mellitus w/Hyperglycemia  Well-controlled with previous A1c levels indicating good management. - Ordered annual labs including A1c, kidney function, liver function, electrolytes, and anemia screening.  Essential hypertension Blood pressure reading is 126/74 mmHg. - Refilled lisinopril  and Amlodipine  prescription.  Mixed hyperlipidemia Managed with Repatha . Awaiting updated cholesterol levels to assess efficacy. - Continue Repatha  therapy. - Ordered lipid panel as part of annual labs.  Personal history of melanoma in situ Melanoma in situ was excised. She is under regular dermatological surveillance. - Continue dermatological follow-up every six months.  General Health Maintenance Routine health maintenance is up to date except for mammogram and lung cancer screening. - Ordered mammogram through [facility] in [place]. - Instructed her to schedule lung cancer screening.   - CBC w/Diff/Platelet - Comprehensive Metabolic Panel (CMET) - lisinopril  (  ZESTRIL ) 20 MG tablet; Take 1 tablet (20 mg total) by mouth daily.  Dispense: 90 tablet; Refill: 0 - amLODipine  (NORVASC ) 2.5 MG tablet; Take 1 tablet (2.5 mg total) by mouth daily.  Dispense: 90 tablet; Refill: 1 - HgB A1c - Lipid Profile - fenofibrate  (TRICOR ) 145 MG tablet; Take 1 tablet (145 mg total) by mouth daily.  Dispense: 90 tablet; Refill: 1 - Evolocumab  (REPATHA  SURECLICK) 140 MG/ML SOAJ; Inject 140 mg into the skin every 14 (fourteen) days.  Dispense: 2 mL; Refill: 5 - zolpidem  (AMBIEN ) 10 MG tablet; Take 1 tablet (10 mg total) by mouth at bedtime as needed for sleep.  Dispense: 90 tablet; Refill: 1 - MM 3D SCREENING MAMMOGRAM BILATERAL BREAST; Future - Ambulatory referral to Gastroenterology   Return in about 6 months (around 06/20/2024).    Sharyle Fischer, DO

## 2023-12-22 ENCOUNTER — Ambulatory Visit: Payer: Self-pay | Admitting: Internal Medicine

## 2023-12-22 LAB — COMPREHENSIVE METABOLIC PANEL WITH GFR
AG Ratio: 1.7 (calc) (ref 1.0–2.5)
ALT: 17 U/L (ref 6–29)
AST: 18 U/L (ref 10–35)
Albumin: 4.5 g/dL (ref 3.6–5.1)
Alkaline phosphatase (APISO): 46 U/L (ref 37–153)
BUN: 17 mg/dL (ref 7–25)
CO2: 28 mmol/L (ref 20–32)
Calcium: 9.6 mg/dL (ref 8.6–10.4)
Chloride: 103 mmol/L (ref 98–110)
Creat: 0.63 mg/dL (ref 0.50–1.05)
Globulin: 2.7 g/dL (ref 1.9–3.7)
Glucose, Bld: 107 mg/dL — ABNORMAL HIGH (ref 65–99)
Potassium: 4.2 mmol/L (ref 3.5–5.3)
Sodium: 139 mmol/L (ref 135–146)
Total Bilirubin: 0.3 mg/dL (ref 0.2–1.2)
Total Protein: 7.2 g/dL (ref 6.1–8.1)
eGFR: 101 mL/min/1.73m2

## 2023-12-22 LAB — CBC WITH DIFFERENTIAL/PLATELET
Absolute Lymphocytes: 2605 {cells}/uL (ref 850–3900)
Absolute Monocytes: 512 {cells}/uL (ref 200–950)
Basophils Absolute: 49 {cells}/uL (ref 0–200)
Basophils Relative: 0.8 %
Eosinophils Absolute: 232 {cells}/uL (ref 15–500)
Eosinophils Relative: 3.8 %
HCT: 39.9 % (ref 35.9–46.0)
Hemoglobin: 13 g/dL (ref 11.7–15.5)
MCH: 29.7 pg (ref 27.0–33.0)
MCHC: 32.6 g/dL (ref 31.6–35.4)
MCV: 91.3 fL (ref 81.4–101.7)
MPV: 9.8 fL (ref 7.5–12.5)
Monocytes Relative: 8.4 %
Neutro Abs: 2702 {cells}/uL (ref 1500–7800)
Neutrophils Relative %: 44.3 %
Platelets: 326 Thousand/uL (ref 140–400)
RBC: 4.37 Million/uL (ref 3.80–5.10)
RDW: 12.3 % (ref 11.0–15.0)
Total Lymphocyte: 42.7 %
WBC: 6.1 Thousand/uL (ref 3.8–10.8)

## 2023-12-22 LAB — LIPID PANEL
Cholesterol: 158 mg/dL
HDL: 68 mg/dL
LDL Cholesterol (Calc): 73 mg/dL
Non-HDL Cholesterol (Calc): 90 mg/dL
Total CHOL/HDL Ratio: 2.3 (calc)
Triglycerides: 85 mg/dL

## 2023-12-22 LAB — HEMOGLOBIN A1C
Hgb A1c MFr Bld: 5.8 % — ABNORMAL HIGH
Mean Plasma Glucose: 120 mg/dL
eAG (mmol/L): 6.6 mmol/L

## 2024-01-12 ENCOUNTER — Other Ambulatory Visit: Payer: Self-pay | Admitting: Internal Medicine

## 2024-01-12 DIAGNOSIS — E1165 Type 2 diabetes mellitus with hyperglycemia: Secondary | ICD-10-CM

## 2024-01-12 DIAGNOSIS — I700 Atherosclerosis of aorta: Secondary | ICD-10-CM

## 2024-01-12 DIAGNOSIS — I2510 Atherosclerotic heart disease of native coronary artery without angina pectoris: Secondary | ICD-10-CM

## 2024-01-12 DIAGNOSIS — E782 Mixed hyperlipidemia: Secondary | ICD-10-CM

## 2024-01-12 NOTE — Telephone Encounter (Signed)
 Requested by interface surescripts. Future visit 06/20/24.  Requested Prescriptions  Pending Prescriptions Disp Refills   REPATHA  SURECLICK 140 MG/ML SOAJ [Pharmacy Med Name: REPATHA  SRCLK 140MG /ML INJ LATEX FR] 2 mL 5    Sig: INJECT 140 MG UNDER THE SKIN EVERY 14 DAYS     Cardiovascular: PCSK9 Inhibitors Passed - 01/12/2024  3:55 PM      Passed - Valid encounter within last 12 months    Recent Outpatient Visits           3 weeks ago Essential hypertension   [facility] Bernardo Fend, DO   6 months ago Type 2 diabetes mellitus with hyperglycemia, without long-term current use of insulin [facility])   [facility] Bernardo Fend, [place]              Passed - Lipid Panel completed within the last 12 months    Cholesterol  Date Value Ref Range Status  12/21/2023 158 <200 mg/dL Final   LDL Cholesterol (Calc)  Date Value Ref Range Status  12/21/2023 73 mg/dL (calc) Final    Comment:    Reference range: <100 . Desirable range <100 mg/dL for primary prevention;   <70 mg/dL for patients with CHD or diabetic patients  with > or = 2 CHD risk factors. SABRA LDL-C is now calculated using the Martin-Hopkins  calculation, which is a validated novel method providing  better accuracy than the Friedewald equation in the  estimation of LDL-C.  Gladis APPLETHWAITE et al. SANDREA. 7986;689(80): 2061-2068  (http://education.[facility].com/faq/FAQ164)    HDL  Date Value Ref Range Status  12/21/2023 68 > OR = 50 mg/dL Final   Triglycerides  Date Value Ref Range Status  12/21/2023 85 <150 mg/dL Final

## 2024-02-28 ENCOUNTER — Ambulatory Visit: Admit: 2024-02-28 | Admitting: Gastroenterology

## 2024-06-20 ENCOUNTER — Ambulatory Visit: Admitting: Internal Medicine
